# Patient Record
Sex: Male | Born: 1950 | Race: White | Hispanic: No | Smoking: Former smoker
Health system: Southern US, Community
[De-identification: ages and names within clinical notes are randomized; demographics above are authoritative.]

## PROBLEM LIST (undated history)

## (undated) DIAGNOSIS — E785 Hyperlipidemia, unspecified: Principal | ICD-10-CM

## (undated) DIAGNOSIS — M1990 Unspecified osteoarthritis, unspecified site: Secondary | ICD-10-CM

## (undated) DIAGNOSIS — C61 Malignant neoplasm of prostate: Secondary | ICD-10-CM

## (undated) DIAGNOSIS — R350 Frequency of micturition: Secondary | ICD-10-CM

## (undated) DIAGNOSIS — Z973 Presence of spectacles and contact lenses: Secondary | ICD-10-CM

## (undated) DIAGNOSIS — F1021 Alcohol dependence, in remission: Secondary | ICD-10-CM

## (undated) HISTORY — PX: PROSTATE BIOPSY: SHX241

## (undated) HISTORY — DX: Malignant neoplasm of prostate: C61

## (undated) HISTORY — DX: Hyperlipidemia, unspecified: E78.5

---

## 2014-08-19 ENCOUNTER — Ambulatory Visit (INDEPENDENT_AMBULATORY_CARE_PROVIDER_SITE_OTHER): Payer: Medicare HMO | Admitting: Medical

## 2014-08-19 ENCOUNTER — Encounter: Payer: Self-pay | Admitting: Medical

## 2014-08-19 VITALS — BP 130/73 | HR 68 | Temp 98.1°F | Ht 70.0 in | Wt 171.6 lb

## 2014-08-19 DIAGNOSIS — F1021 Alcohol dependence, in remission: Secondary | ICD-10-CM

## 2014-08-19 DIAGNOSIS — H269 Unspecified cataract: Secondary | ICD-10-CM

## 2014-08-19 DIAGNOSIS — K4091 Unilateral inguinal hernia, without obstruction or gangrene, recurrent: Secondary | ICD-10-CM

## 2014-08-19 DIAGNOSIS — K4090 Unilateral inguinal hernia, without obstruction or gangrene, not specified as recurrent: Secondary | ICD-10-CM | POA: Insufficient documentation

## 2014-08-19 NOTE — Assessment & Plan Note (Signed)
In recovery now for 17 yrs. No use of alcohol .Attends AA.

## 2014-08-19 NOTE — Assessment & Plan Note (Signed)
This hernia is not new. Gradualy getting larger. No signs or symptoms or incarceration or stangulation. Moderate to large size so will go ahead and refer him to general surgeon.

## 2014-08-19 NOTE — Progress Notes (Signed)
Subjective:    Patient ID: Cory Willis, male    DOB: May 22, 1951, 64 y.o.   MRN: [id number]  HPI   I have reviewed pt PMH, PSH, FH, Social History and Surgical History  Pt in for first visit.   His history is minimal. Pt never had regular visits in the past since he had no insurance. Pt never had check up.  He wears glasses optometrist told him he may have early cataracts.   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 He attends Attends AA 2 meeting a week.   Mother open heat surgery at 78 yo. Dad deceased 70 yo. Health hx?? Minimal contact with family.  Pt uses snuff. (no oral lesions reported)  Pt has recently lt lower quadrant bulge in 2001. No fever, no chills, no vomiting. Occasional mild pain.  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 20 oz coffee total a day, no exercise. Not married.   Some frequent urination at nights.6-7 times at lunch.  Past Medical History  Diagnosis Date  . Cataract     Starting  . Substance abuse     Former Patent examiner    History   Social History  . Marital Status: Divorced    Spouse Name: N/A    Number of Children: N/A  . Years of Education: N/A   Occupational History  . Not on file.   Social History Main Topics  . Smoking status: Former Smoker    Quit date: 08/20/2007  . Smokeless tobacco: Current User    Types: Snuff  . Alcohol Use: No     Comment: Reformed Alcoholic 17 years  . Drug Use: Not on file  . Sexual Activity: Not on file   Other Topics Concern  . Not on file   Social History Narrative  . No narrative on file    No past surgical history on file.  Family History  Problem Relation Age of Onset  . Heart disease Mother     No Known Allergies  No current outpatient prescriptions on file prior to visit.   No current facility-administered medications on file prior to visit.    BP 130/73 mmHg  Pulse 68  Temp(Src) 98.1 F (36.7 C) (Oral)  Ht 5\' 10"  (1.778 m)  Wt 171 lb 9.6 oz (77.837 kg)   BMI 24.62 kg/m2  SpO2 98%      Review of Systems  Constitutional: Negative for fever, chills and fatigue.  HENT: Negative for congestion, ear discharge, ear pain, nosebleeds, postnasal drip, rhinorrhea, sinus pressure, sneezing, sore throat and trouble swallowing.   Respiratory: Negative for cough, chest tightness, shortness of breath and wheezing.   Cardiovascular: Negative for chest pain and palpitations.  Gastrointestinal: Negative for nausea, abdominal pain, diarrhea and rectal pain.  Endocrine: Positive for polyuria.  Genitourinary: Negative for dysuria, frequency, hematuria, flank pain, decreased urine volume, penile swelling, scrotal swelling, difficulty urinating, genital sores, penile pain and testicular pain.       Llq buldge.  Musculoskeletal: Negative for back pain.  Neurological: Negative for dizziness, tremors, seizures, syncope, facial asymmetry, weakness, light-headedness, numbness and headaches.  Hematological: Negative for adenopathy. Does not bruise/bleed easily.  Psychiatric/Behavioral: Negative for suicidal ideas, behavioral problems, self-injury and dysphoric mood. The patient is not nervous/anxious.        Objective:   Physical Exam   General Mental Status- Alert. Orientation- Oriented x3.  Build and Nutrition- Well nourished and Well Developed.  Skin General:-Normal. Color- Normal color. Moisture- Normal.  Temperature-Warm.  HEENT  Ears- Normal. Auditory Canal- Bilateral-Normal. Tympanic Membrane- Bilateral-Normal. Eye Fundi-Bilateral-Normal. Pupil- bilateral- Direct reaction to light normal. Nose & Sinuses- Normal. Nostrils-Bilateral- Normal. Mouth & Throat- Poor dentition.   Neck Neck- No Bruits or Masses. Trachea midline.  Thyroid- Normal.  Chest and Lung Exam Percussion: Quality and Intensity-Percussion normal. Percussion of the chest reveals- No Dullness.  Palpation: Palpation of the chest reveals- Non-tender- No  dullness. Auscultation: Breath Sounds- Normal.  Adventitous Sounds:-No adventitious sounds.  Cardiovascular Inspection:- No Heaves. Auscultation:-Normal sinus rhythm without murmur gallop, S1 WNL and S2 WNL.  Abdomen Inspection:-Inspection Normal. Inspection of the abdomen reveals- No hernias Palpation/Percussion:- Palpation and Percussion of the Abdomen reveal- Non Tender and No Palpable abdominal masses. Liver: Other Characteristics- No hepatomegaly. Spleen:Other Characteristics- No Splenomegaly. Auscultation:- Auscultation of the abdomen reveals- Bowel sounds normal and No Abdominal bruits.  Male Genitourinary Urethra:- No discharge. Penis- Circumcised. Scrotum- No masses. Testes- Bilateral-Normal. Lt inguinal- canal large hernia. Does not reduce easily.  Rectal Anorectal Exam: Performed- Normal sphincter tone. No masses noted. Prostate smooth normal size. Stool HEME Negative.  Peripheral  Vascular Lower Extremity:Inspection- Bilateral-Inspection Normal  Palpation: Femoral pulse- Bilateral- 2+. Popliteal pulse- Bilateral-2+. Dorsalis pedis pulse- Bilateral- 1/2+. Edema- Bilateral- No edema.  Neurologic Mental Status:- Normal. Cranial Nerves:-Normal Bilaterally. Motor:-Normal. Strength:5/5 normal muscle strength-All Muscles. General Assessment of Reflexes: Right Knee-2+. Left Knee- 2+. Coordination-Normal. Gait- Normal.  Meningeal Signs- None.  Musculoskeletal Global Assessment General-Joints show full range of motion without obvious deformity and Normal muscle mass. Strength in upper and lower extremities.  Lymphatics General lymphatics Description- No generalized lymphadenopathy.        Assessment & Plan:  Regarding pt intermittent polyuria will get cmp next week and assess sugar on cpe.

## 2014-08-19 NOTE — Patient Instructions (Addendum)
I am going to refer you to surgeon for your hernia. I want you to reschedule for complete physical next Thursday  Morning at 8:15 and come in fasting.  Today was appointment today history. Meet and greet. CPE/welcome to medicare physcial next week.

## 2014-08-19 NOTE — Assessment & Plan Note (Signed)
Mild early per pt.

## 2014-08-19 NOTE — Progress Notes (Signed)
Pre visit review using our clinic review tool, if applicable. No additional management support is needed unless otherwise documented below in the visit note. 

## 2014-08-26 ENCOUNTER — Telehealth: Payer: Self-pay | Admitting: Medical

## 2014-08-26 ENCOUNTER — Ambulatory Visit (INDEPENDENT_AMBULATORY_CARE_PROVIDER_SITE_OTHER): Payer: Medicare HMO | Admitting: Medical

## 2014-08-26 ENCOUNTER — Encounter: Payer: Self-pay | Admitting: Internal Medicine

## 2014-08-26 ENCOUNTER — Encounter: Payer: Self-pay | Admitting: Medical

## 2014-08-26 VITALS — BP 130/80 | HR 67 | Temp 98.0°F | Ht 70.0 in | Wt 171.2 lb

## 2014-08-26 DIAGNOSIS — H9193 Unspecified hearing loss, bilateral: Secondary | ICD-10-CM

## 2014-08-26 DIAGNOSIS — Z0000 Encounter for general adult medical examination without abnormal findings: Secondary | ICD-10-CM | POA: Insufficient documentation

## 2014-08-26 DIAGNOSIS — Z87891 Personal history of nicotine dependence: Secondary | ICD-10-CM

## 2014-08-26 DIAGNOSIS — Z1211 Encounter for screening for malignant neoplasm of colon: Secondary | ICD-10-CM

## 2014-08-26 DIAGNOSIS — Z0189 Encounter for other specified special examinations: Secondary | ICD-10-CM

## 2014-08-26 DIAGNOSIS — R972 Elevated prostate specific antigen [PSA]: Secondary | ICD-10-CM

## 2014-08-26 DIAGNOSIS — Z136 Encounter for screening for cardiovascular disorders: Secondary | ICD-10-CM

## 2014-08-26 DIAGNOSIS — Z125 Encounter for screening for malignant neoplasm of prostate: Secondary | ICD-10-CM

## 2014-08-26 DIAGNOSIS — Z79899 Other long term (current) drug therapy: Secondary | ICD-10-CM

## 2014-08-26 DIAGNOSIS — Z23 Encounter for immunization: Secondary | ICD-10-CM

## 2014-08-26 LAB — COMPREHENSIVE METABOLIC PANEL
ALBUMIN: 4.2 g/dL (ref 3.5–5.2)
ALT: 8 U/L (ref 0–53)
AST: 11 U/L (ref 0–37)
Alkaline Phosphatase: 56 U/L (ref 39–117)
BILIRUBIN TOTAL: 0.6 mg/dL (ref 0.2–1.2)
BUN: 9 mg/dL (ref 6–23)
CO2: 31 mEq/L (ref 19–32)
Calcium: 10.7 mg/dL — ABNORMAL HIGH (ref 8.4–10.5)
Chloride: 103 mEq/L (ref 96–112)
Creatinine, Ser: 0.81 mg/dL (ref 0.40–1.50)
GFR: 101.43 mL/min (ref >60.00)
Glucose, Bld: 95 mg/dL (ref 70–99)
POTASSIUM: 4.4 meq/L (ref 3.5–5.1)
SODIUM: 139 meq/L (ref 135–145)
TOTAL PROTEIN: 6.8 g/dL (ref 6.0–8.3)

## 2014-08-26 LAB — CBC WITH DIFFERENTIAL/PLATELET
BASOS ABS: 0 10*3/uL (ref 0.0–0.1)
Basophils Relative: 0.2 % (ref 0.0–3.0)
Eosinophils Absolute: 0.1 10*3/uL (ref 0.0–0.7)
Eosinophils Relative: 1.7 % (ref 0.0–5.0)
HCT: 43.5 % (ref 39.0–52.0)
Hemoglobin: 14.9 g/dL (ref 13.0–17.0)
LYMPHS ABS: 0.9 10*3/uL (ref 0.7–4.0)
Lymphocytes Relative: 11.5 % — ABNORMAL LOW (ref 12.0–46.0)
MCHC: 34.2 g/dL (ref 30.0–36.0)
MCV: 88.9 fl (ref 78.0–100.0)
MONO ABS: 0.7 10*3/uL (ref 0.1–1.0)
MONOS PCT: 9.7 % (ref 3.0–12.0)
NEUTROS ABS: 5.9 10*3/uL (ref 1.4–7.7)
Neutrophils Relative %: 76.9 % (ref 43.0–77.0)
PLATELETS: 308 10*3/uL (ref 150.0–400.0)
RBC: 4.9 Mil/uL (ref 4.22–5.81)
RDW: 13.3 % (ref 11.5–15.5)
WBC: 7.7 10*3/uL (ref 4.0–10.5)

## 2014-08-26 LAB — LIPID PANEL
CHOLESTEROL: 190 mg/dL (ref 0–200)
HDL: 47.3 mg/dL (ref >39.00)
LDL Cholesterol: 115 mg/dL — ABNORMAL HIGH (ref 0–99)
NonHDL: 142.7
Total CHOL/HDL Ratio: 4
Triglycerides: 138 mg/dL (ref 0.0–149.0)
VLDL: 27.6 mg/dL (ref 0.0–40.0)

## 2014-08-26 LAB — PSA: PSA: 6.22 ng/mL — AB (ref 0.10–4.00)

## 2014-08-26 MED ORDER — PNEUMOCOCCAL 13-VAL CONJ VACC IM SUSP: 0.5000 mL | INTRAMUSCULAR | Status: DC

## 2014-08-26 MED ORDER — AZITHROMYCIN 250 MG PO TABS: ORAL_TABLET | ORAL | Status: DC

## 2014-08-26 NOTE — Telephone Encounter (Signed)
Rx azithromycin if current mild symptoms on awv worsen as discussed.

## 2014-08-26 NOTE — Progress Notes (Signed)
Subjective:    Patient ID: Cory Willis, male    DOB: 03/18/1951, 64 y.o.   MRN: [id number]  HPI  Pt in today for physical exam years since any care has been done.  Pt never had lipid panel done. No hx of diabetes. No Abdominal ultrasound done in past. No colonsocpy, no psa.. Basically never had any medical care done. Please see HPI on last visit which was extensive.       Review of Systems  See smart set ros. Objective:   Physical Exam See smart set pe.     Assessment & Plan:   Subjective:    Cory Willis is a 64 y.o. male who presents for a welcome to Medicare exam.   Cardiac risk factors: advanced age (older than 28 for men, 12 for women). Pt has not been see in past no diagnosis in past. Mother bypass at 19yo. No smoker.  Depression Screen (Note: if answer to either of the following is "Yes", a more complete depression screening is indicated)  Q1: Over the past two weeks, have you felt down, depressed or hopeless? no Q2: Over the past two weeks, have you felt little interest or pleasure in doing things? no  Activities of Daily Living In your present state of health, do you have any difficulty performing the following activities?:  Preparing food and eating?: No Bathing yourself: No Getting dressed: No Using the toilet:No Moving around from place to place: No In the past year have you fallen or had a near fall?:No  Current exercise habits: Pt does not exercise.   Dietary issues discussed: No dietary issues. Hearing difficulties: No Safe in current home environment: no  Pt did poorly on hearing test. Review of Systems A comprehensive review of systems was negative except for: Mild scratchy throat and mild nasal congestion only.   Pt did serial 7 well/passed and recall 3 objects passed.   Objective:    Vision by Snellen chart: right eye: 20/25, left eye:20/25. Both 20/20 with glasses. Blood pressure 152/94, pulse 67, temperature 98 F (36.7 C),  temperature source Oral, height 5\' 10"  (1.778 m), weight 171 lb 3.2 oz (77.656 kg), SpO2 98 %. Body mass index is 24.56 kg/(m^2).   General Mental Status- Alert. Orientation- Oriented x3.  Build and Nutrition- Well nourished and Well Developed.  Skin General:-Normal. Color- Normal color. Moisture- Normal. Temperature-Warm.  Small moles on back but no worrisome lesions presently.  HEENT  Ears- Normal. Auditory Canal- Bilateral-Normal. Tympanic Membrane- Bilateral-Normal. Eye Fundi-Bilateral-Normal. Pupil- bilateral- Direct reaction to light normal. Nose & Sinuses- Normal. Nostrils-Bilateral- Normal. Mouth & Throat-Normal.  Neck Neck- No Bruits or Masses. Trachea midline.  Thyroid- Normal.  Chest and Lung Exam Percussion: Quality and Intensity-Percussion normal. Percussion of the chest reveals- No Dullness.  Palpation: Palpation of the chest reveals- Non-tender- No dullness. Auscultation: Breath Sounds- Normal.  Adventitous Sounds:-No adventitious sounds.  Cardiovascular Inspection:- No Heaves. Auscultation:-Normal sinus rhythm without murmur gallop, S1 WNL and S2 WNL.  Abdomen Inspection:-Inspection Normal. Inspection of the abdomen reveals- No hernias Palpation/Percussion:- Palpation and Percussion of the Abdomen reveal- Non Tender and No Palpable abdominal masses. Liver: Other Characteristics- No hepatomegaly. Spleen:Other Characteristics- No Splenomegaly. Auscultation:- Auscultation of the abdomen reveals- Bowel sounds normal and No Abdominal bruits.  Male Genitourinary Not done today. Done on last visit.  Rectal Not done today. Done on last visit  Peripheral  Vascular Lower Extremity:Inspection- Bilateral-Inspection Normal  Palpation: Femoral pulse- Bilateral- 2+. Popliteal pulse- Bilateral-2+. Dorsalis pedis pulse- Bilateral- 1/2+.  Edema- Bilateral- No edema.  Neurologic Mental Status:- Normal. Cranial Nerves:-Normal  Bilaterally. Motor:-Normal. Strength:5/5 normal muscle strength-All Muscles. General Assessment of Reflexes: Right Knee-2+. Left Knee- 2+. Coordination-Normal. Gait- Normal.  Meningeal Signs- None.  Musculoskeletal Global Assessment General-Joints show full range of motion without obvious deformity and Normal muscle mass. Strength in upper and lower extremities.  Lymphatics General lymphatics Description- No generalized lymphadenopathy.   Assessment:   Order cbc, cmp, tsh, lipid panel, psa, refer pt to gi screening colonoscopy. Abdomina ultrasound for screening AAA, refer pt to audiology, psv, fluvaccine,(tdap and zostavax discussed).  Pt declined flu vaccine, tdap and zostavax today. He only wants one vaccine  today.  Also will get ct of chest. Pt 40 yr smoker. Stopped 7 yrs ago. On average pack a day.   ADL info explained and given to pt.  ekg done today as well.     Plan:    During the course of the visit the patient was educated and counseled about appropriate screening and preventive services including:  Patient presents for yearly preventative medicine examination. Medicare questionnaire was completed  All immunizations and health maintenance protocols were reviewed with the patient and needed orders were placed.  Appropriate screening laboratory values were ordered for the patient including screening of hyperlipidemia, renal function and hepatic function. If indicated by BPH, a PSA was ordered.  Medication reconciliation,  past medical history, social history, problem list and allergies were reviewed in detail with the patient  Goals were established with regard to weight loss, exercise, and  diet in compliance with medications  End of life planning was discussed.   Patient Instructions (the written plan) was given to the patient.

## 2014-08-26 NOTE — Telephone Encounter (Signed)
Referal to urologist for elevated psa.

## 2014-08-26 NOTE — Patient Instructions (Addendum)
Order cbc, cmp, tsh, lipid panel, psa, refer pt to gi screening colonoscopy. Abdomina ultrasound for screening AAA, refer pt to audiology, psv, fluvaccine,(tdap and zostavax discussed).   If your mild scratchy throat worsens, or nasal congestion turns into sinus pressure then go ahead and start azithromycin. I am making that available. Presently you could just use plane mucinex and flonase otc.  Preventive Care for Adults A healthy lifestyle and preventive care can promote health and wellness. Preventive health guidelines for men include the following key practices:  A routine yearly physical is a good way to check with your health care provider about your health and preventative screening. It is a chance to share any concerns and updates on your health and to receive a thorough exam.  Visit your dentist for a routine exam and preventative care every 6 months. Brush your teeth twice a day and floss once a day. Good oral hygiene prevents tooth decay and gum disease.  The frequency of eye exams is based on your age, health, family medical history, use of contact lenses, and other factors. Follow your health care provider's recommendations for frequency of eye exams.  Eat a healthy diet. Foods such as vegetables, fruits, whole grains, low-fat dairy products, and lean protein foods contain the nutrients you need without too many calories. Decrease your intake of foods high in solid fats, added sugars, and salt. Eat the right amount of calories for you.Get information about a proper diet from your health care provider, if necessary.  Regular physical exercise is one of the most important things you can do for your health. Most adults should get at least 150 minutes of moderate-intensity exercise (any activity that increases your heart rate and causes you to sweat) each week. In addition, most adults need muscle-strengthening exercises on 2 or more days a week.  Maintain a healthy weight. The body mass  index (BMI) is a screening tool to identify possible weight problems. It provides an estimate of body fat based on height and weight. Your health care provider can find your BMI and can help you achieve or maintain a healthy weight.For adults 20 years and older:  A BMI below 18.5 is considered underweight.  A BMI of 18.5 to 24.9 is normal.  A BMI of 25 to 29.9 is considered overweight.  A BMI of 30 and above is considered obese.  Maintain normal blood lipids and cholesterol levels by exercising and minimizing your intake of saturated fat. Eat a balanced diet with plenty of fruit and vegetables. Blood tests for lipids and cholesterol should begin at age 11 and be repeated every 5 years. If your lipid or cholesterol levels are high, you are over 50, or you are at high risk for heart disease, you may need your cholesterol levels checked more frequently.Ongoing high lipid and cholesterol levels should be treated with medicines if diet and exercise are not working.  If you smoke, find out from your health care provider how to quit. If you do not use tobacco, do not start.  Lung cancer screening is recommended for adults aged 2-80 years who are at high risk for developing lung cancer because of a history of smoking. A yearly low-dose CT scan of the lungs is recommended for people who have at least a 30-pack-year history of smoking and are a current smoker or have quit within the past 15 years. A pack year of smoking is smoking an average of 1 pack of cigarettes a day for 1 year (for  example: 1 pack a day for 30 years or 2 packs a day for 15 years). Yearly screening should continue until the smoker has stopped smoking for at least 15 years. Yearly screening should be stopped for people who develop a health problem that would prevent them from having lung cancer treatment.  If you choose to drink alcohol, do not have more than 2 drinks per day. One drink is considered to be 12 ounces (355 mL) of beer, 5  ounces (148 mL) of wine, or 1.5 ounces (44 mL) of liquor.  Avoid use of street drugs. Do not share needles with anyone. Ask for help if you need support or instructions about stopping the use of drugs.  High blood pressure causes heart disease and increases the risk of stroke. Your blood pressure should be checked at least every 1-2 years. Ongoing high blood pressure should be treated with medicines, if weight loss and exercise are not effective.  If you are 36-36 years old, ask your health care provider if you should take aspirin to prevent heart disease.  Diabetes screening involves taking a blood sample to check your fasting blood sugar level. This should be done once every 3 years, after age 20, if you are within normal weight and without risk factors for diabetes. Testing should be considered at a younger age or be carried out more frequently if you are overweight and have at least 1 risk factor for diabetes.  Colorectal cancer can be detected and often prevented. Most routine colorectal cancer screening begins at the age of 42 and continues through age 84. However, your health care provider may recommend screening at an earlier age if you have risk factors for colon cancer. On a yearly basis, your health care provider may provide home test kits to check for hidden blood in the stool. Use of a small camera at the end of a tube to directly examine the colon (sigmoidoscopy or colonoscopy) can detect the earliest forms of colorectal cancer. Talk to your health care provider about this at age 78, when routine screening begins. Direct exam of the colon should be repeated every 5-10 years through age 10, unless early forms of precancerous polyps or small growths are found.  People who are at an increased risk for hepatitis B should be screened for this virus. You are considered at high risk for hepatitis B if:  You were born in a country where hepatitis B occurs often. Talk with your health care  provider about which countries are considered high risk.  Your parents were born in a high-risk country and you have not received a shot to protect against hepatitis B (hepatitis B vaccine).  You have HIV or AIDS.  You use needles to inject street drugs.  You live with, or have sex with, someone who has hepatitis B.  You are a man who has sex with other men (MSM).  You get hemodialysis treatment.  You take certain medicines for conditions such as cancer, organ transplantation, and autoimmune conditions.  Hepatitis C blood testing is recommended for all people born from [birth year] through 1965 and any individual with known risks for hepatitis C.  Practice safe sex. Use condoms and avoid high-risk sexual practices to reduce the spread of sexually transmitted infections (STIs). STIs include gonorrhea, chlamydia, syphilis, trichomonas, herpes, HPV, and human immunodeficiency virus (HIV). Herpes, HIV, and HPV are viral illnesses that have no cure. They can result in disability, cancer, and death.  If you are at risk of  being infected with HIV, it is recommended that you take a prescription medicine daily to prevent HIV infection. This is called preexposure prophylaxis (PrEP). You are considered at risk if:  You are a man who has sex with other men (MSM) and have other risk factors.  You are a heterosexual man, are sexually active, and are at increased risk for HIV infection.  You take drugs by injection.  You are sexually active with a partner who has HIV.  Talk with your health care provider about whether you are at high risk of being infected with HIV. If you choose to begin PrEP, you should first be tested for HIV. You should then be tested every 3 months for as long as you are taking PrEP.  A one-time screening for abdominal aortic aneurysm (AAA) and surgical repair of large AAAs by ultrasound are recommended for men ages 27 to 39 years who are current or former smokers.  Healthy men  should no longer receive prostate-specific antigen (PSA) blood tests as part of routine cancer screening. Talk with your health care provider about prostate cancer screening.  Testicular cancer screening is not recommended for adult males who have no symptoms. Screening includes self-exam, a health care provider exam, and other screening tests. Consult with your health care provider about any symptoms you have or any concerns you have about testicular cancer.  Use sunscreen. Apply sunscreen liberally and repeatedly throughout the day. You should seek shade when your shadow is shorter than you. Protect yourself by wearing long sleeves, pants, a wide-brimmed hat, and sunglasses year round, whenever you are outdoors.  Once a month, do a whole-body skin exam, using a mirror to look at the skin on your back. Tell your health care provider about new moles, moles that have irregular borders, moles that are larger than a pencil eraser, or moles that have changed in shape or color.  Stay current with required vaccines (immunizations).  Influenza vaccine. All adults should be immunized every year.  Tetanus, diphtheria, and acellular pertussis (Td, Tdap) vaccine. An adult who has not previously received Tdap or who does not know his vaccine status should receive 1 dose of Tdap. This initial dose should be followed by tetanus and diphtheria toxoids (Td) booster doses every 10 years. Adults with an unknown or incomplete history of completing a 3-dose immunization series with Td-containing vaccines should begin or complete a primary immunization series including a Tdap dose. Adults should receive a Td booster every 10 years.  Varicella vaccine. An adult without evidence of immunity to varicella should receive 2 doses or a second dose if he has previously received 1 dose.  Human papillomavirus (HPV) vaccine. Males aged 2-21 years who have not received the vaccine previously should receive the 3-dose series. Males  aged 22-26 years may be immunized. Immunization is recommended through the age of 37 years for any male who has sex with males and did not get any or all doses earlier. Immunization is recommended for any person with an immunocompromised condition through the age of 67 years if he did not get any or all doses earlier. During the 3-dose series, the second dose should be obtained 4-8 weeks after the first dose. The third dose should be obtained 24 weeks after the first dose and 16 weeks after the second dose.  Zoster vaccine. One dose is recommended for adults aged 68 years or older unless certain conditions are present.  Measles, mumps, and rubella (MMR) vaccine. Adults born before [birth year] generally  are considered immune to measles and mumps. Adults born in [birth year] or later should have 1 or more doses of MMR vaccine unless there is a contraindication to the vaccine or there is laboratory evidence of immunity to each of the three diseases. A routine second dose of MMR vaccine should be obtained at least 28 days after the first dose for students attending postsecondary schools, health care workers, or international travelers. People who received inactivated measles vaccine or an unknown type of measles vaccine during 1963-1967 should receive 2 doses of MMR vaccine. People who received inactivated mumps vaccine or an unknown type of mumps vaccine before 1979 and are at high risk for mumps infection should consider immunization with 2 doses of MMR vaccine. Unvaccinated health care workers born before [birth year] who lack laboratory evidence of measles, mumps, or rubella immunity or laboratory confirmation of disease should consider measles and mumps immunization with 2 doses of MMR vaccine or rubella immunization with 1 dose of MMR vaccine.  Pneumococcal 13-valent conjugate (PCV13) vaccine. When indicated, a person who is uncertain of his immunization history and has no record of immunization should receive the PCV13 vaccine.  An adult aged 33 years or older who has certain medical conditions and has not been previously immunized should receive 1 dose of PCV13 vaccine. This PCV13 should be followed with a dose of pneumococcal polysaccharide (PPSV23) vaccine. The PPSV23 vaccine dose should be obtained at least 8 weeks after the dose of PCV13 vaccine. An adult aged 66 years or older who has certain medical conditions and previously received 1 or more doses of PPSV23 vaccine should receive 1 dose of PCV13. The PCV13 vaccine dose should be obtained 1 or more years after the last PPSV23 vaccine dose.  Pneumococcal polysaccharide (PPSV23) vaccine. When PCV13 is also indicated, PCV13 should be obtained first. All adults aged 26 years and older should be immunized. An adult younger than age 58 years who has certain medical conditions should be immunized. Any person who resides in a nursing home or long-term care facility should be immunized. An adult smoker should be immunized. People with an immunocompromised condition and certain other conditions should receive both PCV13 and PPSV23 vaccines. People with human immunodeficiency virus (HIV) infection should be immunized as soon as possible after diagnosis. Immunization during chemotherapy or radiation therapy should be avoided. Routine use of PPSV23 vaccine is not recommended for American Indians, [place] Natives, or people younger than 65 years unless there are medical conditions that require PPSV23 vaccine. When indicated, people who have unknown immunization and have no record of immunization should receive PPSV23 vaccine. One-time revaccination 5 years after the first dose of PPSV23 is recommended for people aged 19-64 years who have chronic kidney failure, nephrotic syndrome, asplenia, or immunocompromised conditions. People who received 1-2 doses of PPSV23 before age 30 years should receive another dose of PPSV23 vaccine at age 38 years or later if at least 5 years have passed since the  previous dose. Doses of PPSV23 are not needed for people immunized with PPSV23 at or after age 3 years.  Meningococcal vaccine. Adults with asplenia or persistent complement component deficiencies should receive 2 doses of quadrivalent meningococcal conjugate (MenACWY-D) vaccine. The doses should be obtained at least 2 months apart. Microbiologists working with certain meningococcal bacteria, [place] recruits, people at risk during an outbreak, and people who travel to or live in countries with a high rate of meningitis should be immunized. A first-year college student up through age 56 years who is  living in a residence hall should receive a dose if he did not receive a dose on or after his 16th birthday. Adults who have certain high-risk conditions should receive one or more doses of vaccine.  Hepatitis A vaccine. Adults who wish to be protected from this disease, have certain high-risk conditions, work with hepatitis A-infected animals, work in hepatitis A research labs, or travel to or work in countries with a high rate of hepatitis A should be immunized. Adults who were previously unvaccinated and who anticipate close contact with an international adoptee during the first 60 days after arrival in the [place] from a country with a high rate of hepatitis A should be immunized.  Hepatitis B vaccine. Adults should be immunized if they wish to be protected from this disease, have certain high-risk conditions, may be exposed to blood or other infectious body fluids, are household contacts or sex partners of hepatitis B positive people, are clients or workers in certain care facilities, or travel to or work in countries with a high rate of hepatitis B.  Haemophilus influenzae type b (Hib) vaccine. A previously unvaccinated person with asplenia or sickle cell disease or having a scheduled splenectomy should receive 1 dose of Hib vaccine. Regardless of previous immunization, a recipient of a  hematopoietic stem cell transplant should receive a 3-dose series 6-12 months after his successful transplant. Hib vaccine is not recommended for adults with HIV infection. Preventive Service / Frequency Ages 62 to 59  Blood pressure check.** / Every 1 to 2 years.  Lipid and cholesterol check.** / Every 5 years beginning at age 50.  Hepatitis C blood test.** / For any individual with known risks for hepatitis C.  Skin self-exam. / Monthly.  Influenza vaccine. / Every year.  Tetanus, diphtheria, and acellular pertussis (Tdap, Td) vaccine.** / Consult your health care provider. 1 dose of Td every 10 years.  Varicella vaccine.** / Consult your health care provider.  HPV vaccine. / 3 doses over 6 months, if 70 or younger.  Measles, mumps, rubella (MMR) vaccine.** / You need at least 1 dose of MMR if you were born in 1957 or later. You may also need a second dose.  Pneumococcal 13-valent conjugate (PCV13) vaccine.** / Consult your health care provider.  Pneumococcal polysaccharide (PPSV23) vaccine.** / 1 to 2 doses if you smoke cigarettes or if you have certain conditions.  Meningococcal vaccine.** / 1 dose if you are age 12 to 15 years and a Market researcher living in a residence hall, or have one of several medical conditions. You may also need additional booster doses.  Hepatitis A vaccine.** / Consult your health care provider.  Hepatitis B vaccine.** / Consult your health care provider.  Haemophilus influenzae type b (Hib) vaccine.** / Consult your health care provider. Ages 23 to 71  Blood pressure check.** / Every 1 to 2 years.  Lipid and cholesterol check.** / Every 5 years beginning at age 54.  Lung cancer screening. / Every year if you are aged 56-80 years and have a 30-pack-year history of smoking and currently smoke or have quit within the past 15 years. Yearly screening is stopped once you have quit smoking for at least 15 years or develop a health problem  that would prevent you from having lung cancer treatment.  Fecal occult blood test (FOBT) of stool. / Every year beginning at age 49 and continuing until age 60. You may not have to do this test if you get a colonoscopy  every 10 years.  Flexible sigmoidoscopy** or colonoscopy.** / Every 5 years for a flexible sigmoidoscopy or every 10 years for a colonoscopy beginning at age 50 and continuing until age 48.  Hepatitis C blood test.** / For all people born from [birth year] through 1967 and any individual with known risks for hepatitis C.  Skin self-exam. / Monthly.  Influenza vaccine. / Every year.  Tetanus, diphtheria, and acellular pertussis (Tdap/Td) vaccine.** / Consult your health care provider. 1 dose of Td every 10 years.  Varicella vaccine.** / Consult your health care provider.  Zoster vaccine.** / 1 dose for adults aged 23 years or older.  Measles, mumps, rubella (MMR) vaccine.** / You need at least 1 dose of MMR if you were born in 1959 or later. You may also need a second dose.  Pneumococcal 13-valent conjugate (PCV13) vaccine.** / Consult your health care provider.  Pneumococcal polysaccharide (PPSV23) vaccine.** / 1 to 2 doses if you smoke cigarettes or if you have certain conditions.  Meningococcal vaccine.** / Consult your health care provider.  Hepatitis A vaccine.** / Consult your health care provider.  Hepatitis B vaccine.** / Consult your health care provider.  Haemophilus influenzae type b (Hib) vaccine.** / Consult your health care provider. Ages 34 and over  Blood pressure check.** / Every 1 to 2 years.  Lipid and cholesterol check.**/ Every 5 years beginning at age 66.  Lung cancer screening. / Every year if you are aged 74-80 years and have a 30-pack-year history of smoking and currently smoke or have quit within the past 15 years. Yearly screening is stopped once you have quit smoking for at least 15 years or develop a health problem that would prevent you from  having lung cancer treatment.  Fecal occult blood test (FOBT) of stool. / Every year beginning at age 74 and continuing until age 6. You may not have to do this test if you get a colonoscopy every 10 years.  Flexible sigmoidoscopy** or colonoscopy.** / Every 5 years for a flexible sigmoidoscopy or every 10 years for a colonoscopy beginning at age 48 and continuing until age 76.  Hepatitis C blood test.** / For all people born from [birth year] through 1967 and any individual with known risks for hepatitis C.  Abdominal aortic aneurysm (AAA) screening.** / A one-time screening for ages 71 to 64 years who are current or former smokers.  Skin self-exam. / Monthly.  Influenza vaccine. / Every year.  Tetanus, diphtheria, and acellular pertussis (Tdap/Td) vaccine.** / 1 dose of Td every 10 years.  Varicella vaccine.** / Consult your health care provider.  Zoster vaccine.** / 1 dose for adults aged 32 years or older.  Pneumococcal 13-valent conjugate (PCV13) vaccine.** / Consult your health care provider.  Pneumococcal polysaccharide (PPSV23) vaccine.** / 1 dose for all adults aged 74 years and older.  Meningococcal vaccine.** / Consult your health care provider.  Hepatitis A vaccine.** / Consult your health care provider.  Hepatitis B vaccine.** / Consult your health care provider.  Haemophilus influenzae type b (Hib) vaccine.** / Consult your health care provider. **Family history and personal history of risk and conditions may change your health care provider's recommendations. Document Released: 09/18/2001 Document Revised: 07/28/2013 Document Reviewed: 12/18/2010 [facility] Patient Information 2015 [place], [place]. This information is not intended to replace advice given to you by your health care provider. Make sure you discuss any questions you have with your health care provider.

## 2014-08-26 NOTE — Progress Notes (Signed)
Pre visit review using our clinic review tool, if applicable. No additional management support is needed unless otherwise documented below in the visit note. 

## 2014-08-30 ENCOUNTER — Encounter (INDEPENDENT_AMBULATORY_CARE_PROVIDER_SITE_OTHER): Payer: Self-pay | Admitting: General Surgery

## 2014-08-30 NOTE — Progress Notes (Signed)
Patient ID: Cory Willis, male   DOB: 04-09-1951, 63 y.o.   MRN: [id number]  Cory Willis 08/30/2014 1:52 PM Location: [facility] Patient #: [id number] DOB: [birth year] Single / Language: Cleophus Willis / Race: White Male History of Present Illness Cory Hollingshead MD; 08/30/2014 2:59 PM) Patient words: new patient hernia.  The patient is a 64 year old male    Note:He is referred by Cory Pai, PA because of a left inguinal hernia. He has had the hernia since 2001. It has slowly been getting larger and is uncomfortable at times. He manually reduces it daily. No difficulty with urination or BMs. He wears hernia underwear. He is interested in hernia repair.  Past Surgical History Cory Leventhal, RN, BSN; 08/30/2014 1:52 PM) No pertinent past surgical history  Diagnostic Studies History (Cory Leventhal, RN, BSN; 08/30/2014 1:52 PM) Colonoscopy never  Allergies Cory Leventhal, RN, BSN; 08/30/2014 1:53 PM) No Known Drug Allergies 08/30/2014  Medication History Cory Leventhal, RN, BSN; 08/30/2014 1:53 PM) No Current Medications Medications Reconciled  Social History Cory Leventhal, RN, BSN; 08/30/2014 1:52 PM) Alcohol use Remotely quit alcohol use. Caffeine use Coffee, Tea. No drug use Tobacco use Former smoker.  Family History Multimedia programmer, Therapist, sports, BSN; 08/30/2014 1:52 PM) Family history unknown First Degree Relatives     Review of Systems Occupational hygienist, BSN; 08/30/2014 1:52 PM) General Not Present- Appetite Loss, Chills, Fatigue, Fever, Night Sweats, Weight Gain and Weight Loss. Skin Not Present- Change in Wart/Mole, Dryness, Hives, Jaundice, New Lesions, Non-Healing Wounds, Rash and Ulcer. HEENT Present- Wears glasses/contact lenses. Not Present- Earache, Hearing Loss, Hoarseness, Nose Bleed, Oral Ulcers, Ringing in the Ears, Seasonal Allergies, Sinus Pain, Sore Throat, Visual Disturbances and Yellow Eyes. Respiratory Present- Snoring. Not  Present- Bloody sputum, Chronic Cough, Difficulty Breathing and Wheezing. Cardiovascular Not Present- Chest Pain, Difficulty Breathing Lying Down, Leg Cramps, Palpitations, Rapid Heart Rate, Shortness of Breath and Swelling of Extremities. Gastrointestinal Not Present- Abdominal Pain, Bloating, Bloody Stool, Change in Bowel Habits, Chronic diarrhea, Constipation, Difficulty Swallowing, Excessive gas, Gets full quickly at meals, Hemorrhoids, Indigestion, Nausea, Rectal Pain and Vomiting. Male Genitourinary Present- Change in Urinary Stream and Frequency. Not Present- Blood in Urine, Impotence, Nocturia, Painful Urination, Urgency and Urine Leakage. Musculoskeletal Present- Back Pain and Joint Pain. Not Present- Joint Stiffness, Muscle Pain, Muscle Weakness and Swelling of Extremities. Neurological Present- Headaches. Not Present- Decreased Memory, Fainting, Numbness, Seizures, Tingling, Tremor, Trouble walking and Weakness. Psychiatric Not Present- Anxiety, Bipolar, Change in Sleep Pattern, Depression, Fearful and Frequent crying. Endocrine Present- Cold Intolerance. Not Present- Excessive Hunger, Hair Changes, Heat Intolerance, Hot flashes and New Diabetes.  Vitals Occupational hygienist, BSN; 08/30/2014 1:53 PM) 08/30/2014 1:52 PM Weight: 167.4 lb Height: 70in Body Surface Area: 1.94 m Body Mass Index: 24.02 kg/m Temp.: 98.48F(Oral)  Pulse: 68 (Regular)  Resp.: 18 (Unlabored)  BP: 122/62 (Sitting, Left Arm, Standard)     Physical Exam Cory Hollingshead MD; 08/30/2014 3:01 PM)  The physical exam findings are as follows: Note:General: WDWN in NAD. Pleasant and cooperative.  CV: RRR, no murmur, no JVD.  CHEST: Breath sounds equal and clear. Respirations nonlabored..  ABDOMEN: Soft, nontender, no masses.  GU: Large, reducible left inguinal bulge; no right inguinal bulge; no testicular masses  NEUROLOGIC: Alert and oriented, answers questions appropriately, normal gait and  station.  PSYCHIATRIC: Normal mood, affect , and behavior.    Assessment & Plan Cory Hollingshead MD; 08/30/2014 3:02 PM)  LEFT INGUINAL HERNIA (550.90  K40.90) Impression: This  is getting larger and is symptomatic.  Plan: I recommended open repair with mesh and he is agreeable to this. He does have a colonoscopy scheduled for early March and we discussed doing the hernia repair after the colonoscopy. I have explained the procedure, risks, and aftercare of inguinal hernia repair. Risks include but are not limited to bleeding, infection, wound problems, anesthesia, recurrence, bladder or intestine injury, urinary retention, testicular dysfunction, chronic pain, mesh problems. He seems to understand and agrees to proceed. If he is unable to find help at home, he may need to stay one night in the hospital.  Current Plans Free Text Instructions : discussed with patient and provided information. Follow up as needed Schedule for Surgery  Cory Confer, MD

## 2014-09-01 ENCOUNTER — Encounter: Payer: Self-pay | Admitting: Medical

## 2014-09-02 ENCOUNTER — Encounter: Payer: Self-pay | Admitting: Medical

## 2014-09-02 ENCOUNTER — Ambulatory Visit (HOSPITAL_BASED_OUTPATIENT_CLINIC_OR_DEPARTMENT_OTHER)
Admission: RE | Admit: 2014-09-02 | Discharge: 2014-09-02 | Disposition: A | Discharge status: Home / Self Care | Payer: Medicare HMO | Source: Ambulatory Visit | Attending: Medical | Admitting: Medical

## 2014-09-02 DIAGNOSIS — I700 Atherosclerosis of aorta: Secondary | ICD-10-CM | POA: Insufficient documentation

## 2014-09-02 DIAGNOSIS — I2510 Atherosclerotic heart disease of native coronary artery without angina pectoris: Secondary | ICD-10-CM | POA: Insufficient documentation

## 2014-09-02 DIAGNOSIS — J439 Emphysema, unspecified: Secondary | ICD-10-CM | POA: Diagnosis not present

## 2014-09-02 DIAGNOSIS — Z122 Encounter for screening for malignant neoplasm of respiratory organs: Secondary | ICD-10-CM | POA: Diagnosis not present

## 2014-09-02 DIAGNOSIS — Z87891 Personal history of nicotine dependence: Secondary | ICD-10-CM | POA: Insufficient documentation

## 2014-09-02 DIAGNOSIS — Z136 Encounter for screening for cardiovascular disorders: Secondary | ICD-10-CM | POA: Diagnosis present

## 2014-09-06 ENCOUNTER — Other Ambulatory Visit: Payer: Self-pay

## 2014-09-06 MED ORDER — SIMVASTATIN 20 MG PO TABS: 20.0000 mg | ORAL_TABLET | Freq: Every day | ORAL | Status: DC

## 2014-09-30 ENCOUNTER — Ambulatory Visit (AMBULATORY_SURGERY_CENTER): Payer: Self-pay | Admitting: *Deleted

## 2014-09-30 VITALS — Ht 70.0 in | Wt 168.8 lb

## 2014-09-30 DIAGNOSIS — Z1211 Encounter for screening for malignant neoplasm of colon: Secondary | ICD-10-CM

## 2014-09-30 MED ORDER — MOVIPREP 100 G PO SOLR
1.0000 | Freq: Once | ORAL | Status: DC
Start: 2014-09-30 — End: 2014-10-14

## 2014-09-30 NOTE — Progress Notes (Signed)
No egg or soy allergy No diet pills No home 02 use No past hx of any sedation Pt declined emmi video Pt states he may have an issue with his care partner at the time he is scheduled so he may have to call and  RS. He wasn't aware that the care partner had to stay the whole 2-3 hour either so this may aso be an issue. Gave pt the appointment mate info as well as the [facility] pamphlet and pt states he will work it out but has the number to call if necessary Pt has an inguinal hernia surgery scheduled for 10-18-2014 but has no past sedation

## 2014-10-08 ENCOUNTER — Ambulatory Visit: Payer: Medicare HMO | Attending: Audiology | Admitting: Audiology

## 2014-10-08 DIAGNOSIS — H905 Unspecified sensorineural hearing loss: Secondary | ICD-10-CM

## 2014-10-08 NOTE — Procedures (Signed)
   [facility] [address] [place], [place]  [address] [place] EVALUATION  Patient Name: Cory Willis  Medical Record Number:  [id number] Date of Birth:  22-Nov-1950     Date of Test:  10/08/2014  HISTORY:  Cory Willis, a  64 y.o. old male was seen for audiological evaluation upon referral of Saguier, Gopal, [place].   The patient reported a gradual decrease of hearing for the past 15 years. He denies middle ear pathology, tinnitus, facial numbness or vertigo, but does report a familial history of hearing loss (brother).  Further, [name] has been exposed to occupational and recreational noise exposure all of his life without the use of ear protection.  His chief complaint is difficulty understanding conversational speech.   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  Speech reception thresholds were consistent with the pure tone results indicative of good test reliability.  Speech recognition testing was conducted in each ear independently, at a comfortable listening level (70-75dBHL) and indicated 100% and 100% in the right and left ears respectively.  Speech recognition abilities while in the presence of a soft background noise (s/n+5) were also evaluated.  Under this condition, scores were 76% in the right and 64% in the left ear.   Impedance audiometry was utilized and normal Type A Tympanograms were obtained on both sides supporting good middle ear function.   Acoustic reflexes were tested from 500Hz  - 4000Hz  and were present on the right side and present on the left side.  Distortion Product Otoacoustic Emissions were tested from 2000Hz  - 10000Hz  and were weak or absent on the right side and absent on the left side, indicative of poor outer hair cell function within in the inner  ear.  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  Good middle ear function bilaterally.  RECOMMENDATIONS:    1. Annual audiological re-evaluations  2. [name] is a good hearing aid candidate and therefore a hearing aid assessment/fitting  is recommended.   3. In addition, there are also ways to improve one's listening environment. Such as:  (A) When in a restaurant, sit with your back against a perimeter wall, thus reducing the extraneous noise.   (B) When in meetings, do not sit near an open doorway and position yourself within 8-10ft of the speaker. Front and center is typically best.  (C) Eliminate background noises at home such as the television, dishwasher, etc when having important conversations.  (D) Ask family members to face you when they speak and not to drop their heads or turn and walkway while still speaking.   4. Because [name] has a weakened auditory system he is more at risk from damage of noise exposure than someone with a healthy auditory system and therefore should avoid excessive nose exposure if possible or certainly utilize ear protection.     Ivonne Andrew Pugh, Au.D. Doctor of Audiology CCC-A

## 2014-10-08 NOTE — Patient Instructions (Signed)
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  Good middle ear function bilaterally.  RECOMMENDATIONS:    1. Annual audiological re-evaluations  2. Mr. Lewellen is a good hearing aid candidate and therefore a hearing aid assessment/fitting  is recommended.   3. In addition, there are also ways to improve one's listening environment. Such as:  (A) When in a restaurant, sit with your back against a perimeter wall, thus reducing the extraneous noise.   (B) When in meetings, do not sit near an open doorway and position yourself within 8-10ft of the speaker. Front and center is typically best.  (C) Eliminate background noises at home such as the television, dishwasher, etc when having important conversations.  (D) Ask family members to face you when they speak and not to drop their heads or turn and walkway while still speaking.   4. Because Mr. Weissinger has a weakened auditory system he is more at risk from damage of noise exposure than someone with a healthy auditory system and therefore should avoid excessive nose exposure if possible or certainly utilize ear protection.     Ivonne Andrew Pugh, Au.D. Doctor of Audiology CCC-A

## 2014-10-08 NOTE — Pre-Procedure Instructions (Signed)
Jakobee Brackins  10/08/2014   Your procedure is scheduled on:  Mon, Mar 14 @ 9:30 AM  Report to [facility] at 7:30 AM.  Call this number if you have problems the morning of surgery: [phone]   Remember:   Do not eat food or drink liquids after midnight.   Take these medicines the morning of surgery with A SIP OF WATER:                 No Goody's,BC's,Aleve,Aspirin,Ibuprofen,Fish Oil,or any Herbal Medications.   Do not wear jewelry.  Do not wear lotions, powders, or colognes. You may wear deodorant.  Men may shave face and neck.  Do not bring valuables to the hospital.  [facility] is not responsible                  for any belongings or valuables.               Contacts, dentures or bridgework may not be worn into surgery.  Leave suitcase in the car. After surgery it may be brought to your room.  For patients admitted to the hospital, discharge time is determined by your                treatment team.               Patients discharged the day of surgery will not be allowed to drive  home.    Special Instructions:  [facility] - Preparing for Surgery  Before surgery, you can play an important role.  Because skin is not sterile, your skin needs to be as free of germs as possible.  You can reduce the number of germs on you skin by washing with CHG (chlorahexidine gluconate) soap before surgery.  CHG is an antiseptic cleaner which kills germs and bonds with the skin to continue killing germs even after washing.  Please DO NOT use if you have an allergy to CHG or antibacterial soaps.  If your skin becomes reddened/irritated stop using the CHG and inform your nurse when you arrive at Short Stay.  Do not shave (including legs and underarms) for at least 48 hours prior to the first CHG shower.  You may shave your face.  Please follow these instructions carefully:   1.  Shower with CHG Soap the night before surgery and the                                morning of Surgery.  2.   If you choose to wash your hair, wash your hair first as usual with your       normal shampoo.  3.  After you shampoo, rinse your hair and body thoroughly to remove the                      Shampoo.  4.  Use CHG as you would any other liquid soap.  You can apply chg directly       to the skin and wash gently with scrungie or a clean washcloth.  5.  Apply the CHG Soap to your body ONLY FROM THE NECK DOWN.        Do not use on open wounds or open sores.  Avoid contact with your eyes,       ears, mouth and genitals (private parts).  Wash genitals (private parts)  with your normal soap.  6.  Wash thoroughly, paying special attention to the area where your surgery        will be performed.  7.  Thoroughly rinse your body with warm water from the neck down.  8.  DO NOT shower/wash with your normal soap after using and rinsing off       the CHG Soap.  9.  Pat yourself dry with a clean towel.            10.  Wear clean pajamas.            11.  Place clean sheets on your bed the night of your first shower and do not        sleep with pets.  Day of Surgery  Do not apply any lotions/deoderants the morning of surgery.  Please wear clean clothes to the hospital/surgery center.    Please read over the following fact sheets that you were given: Pain Booklet, Coughing and Deep Breathing and Surgical Site Infection Prevention

## 2014-10-11 ENCOUNTER — Encounter (HOSPITAL_COMMUNITY): Payer: Self-pay

## 2014-10-11 ENCOUNTER — Encounter (HOSPITAL_COMMUNITY)
Admission: RE | Admit: 2014-10-11 | Discharge: 2014-10-11 | Disposition: A | Discharge status: Home / Self Care | Payer: Medicare HMO | Source: Ambulatory Visit | Attending: General Surgery | Admitting: General Surgery

## 2014-10-11 DIAGNOSIS — Z01812 Encounter for preprocedural laboratory examination: Secondary | ICD-10-CM | POA: Insufficient documentation

## 2014-10-11 DIAGNOSIS — K4090 Unilateral inguinal hernia, without obstruction or gangrene, not specified as recurrent: Secondary | ICD-10-CM | POA: Diagnosis not present

## 2014-10-11 HISTORY — DX: Unspecified osteoarthritis, unspecified site: M19.90

## 2014-10-11 LAB — COMPREHENSIVE METABOLIC PANEL
ALT: 18 U/L (ref 0–53)
AST: 41 U/L — ABNORMAL HIGH (ref 0–37)
Albumin: 4.1 g/dL (ref 3.5–5.2)
Alkaline Phosphatase: 56 U/L (ref 39–117)
Anion gap: 3 — ABNORMAL LOW (ref 5–15)
BUN: 12 mg/dL (ref 6–23)
CALCIUM: 10.1 mg/dL (ref 8.4–10.5)
CO2: 29 mmol/L (ref 19–32)
Chloride: 104 mmol/L (ref 96–112)
Creatinine, Ser: 0.78 mg/dL (ref 0.50–1.35)
GLUCOSE: 112 mg/dL — AB (ref 70–99)
Potassium: 4.5 mmol/L (ref 3.5–5.1)
SODIUM: 136 mmol/L (ref 135–145)
Total Bilirubin: 0.9 mg/dL (ref 0.3–1.2)
Total Protein: 7.4 g/dL (ref 6.0–8.3)

## 2014-10-11 LAB — CBC WITH DIFFERENTIAL/PLATELET
Basophils Absolute: 0 10*3/uL (ref 0.0–0.1)
Basophils Relative: 0 % (ref 0–1)
Eosinophils Absolute: 0.2 10*3/uL (ref 0.0–0.7)
Eosinophils Relative: 3 % (ref 0–5)
HCT: 44.9 % (ref 39.0–52.0)
HEMOGLOBIN: 15.4 g/dL (ref 13.0–17.0)
LYMPHS ABS: 1.4 10*3/uL (ref 0.7–4.0)
Lymphocytes Relative: 22 % (ref 12–46)
MCH: 30.1 pg (ref 26.0–34.0)
MCHC: 34.3 g/dL (ref 30.0–36.0)
MCV: 87.9 fL (ref 78.0–100.0)
MONOS PCT: 13 % — AB (ref 3–12)
Monocytes Absolute: 0.8 10*3/uL (ref 0.1–1.0)
NEUTROS ABS: 3.8 10*3/uL (ref 1.7–7.7)
NEUTROS PCT: 62 % (ref 43–77)
PLATELETS: 300 10*3/uL (ref 150–400)
RBC: 5.11 MIL/uL (ref 4.22–5.81)
RDW: 12.9 % (ref 11.5–15.5)
WBC: 6.2 10*3/uL (ref 4.0–10.5)

## 2014-10-11 LAB — PROTIME-INR
INR: 1.05 (ref 0.00–1.49)
PROTHROMBIN TIME: 13.8 s (ref 11.6–15.2)

## 2014-10-11 NOTE — Progress Notes (Signed)
Pt. Informed not to take any aspirin from here to surgery date. Pt. Agrees.

## 2014-10-14 ENCOUNTER — Encounter: Payer: Self-pay | Admitting: Internal Medicine

## 2014-10-14 ENCOUNTER — Ambulatory Visit (AMBULATORY_SURGERY_CENTER): Payer: Medicare HMO | Admitting: Internal Medicine

## 2014-10-14 VITALS — BP 112/77 | HR 59 | Temp 96.6°F | Resp 16 | Ht 70.0 in | Wt 168.0 lb

## 2014-10-14 DIAGNOSIS — Z1211 Encounter for screening for malignant neoplasm of colon: Secondary | ICD-10-CM

## 2014-10-14 MED ORDER — SODIUM CHLORIDE 0.9 % IV SOLN: 500.0000 mL | INTRAVENOUS | Status: DC

## 2014-10-14 NOTE — Patient Instructions (Signed)
YOU HAD AN ENDOSCOPIC PROCEDURE TODAY AT THE [facility]:   Refer to the procedure report that was given to you for any specific questions about what was found during the examination.  If the procedure report does not answer your questions, please call your gastroenterologist to clarify.  If you requested that your care partner not be given the details of your procedure findings, then the procedure report has been included in a sealed envelope for you to review at your convenience later.  YOU SHOULD EXPECT: Some feelings of bloating in the abdomen. Passage of more gas than usual.  Walking can help get rid of the air that was put into your GI tract during the procedure and reduce the bloating. If you had a lower endoscopy (such as a colonoscopy or flexible sigmoidoscopy) you may notice spotting of blood in your stool or on the toilet paper. If you underwent a bowel prep for your procedure, you may not have a normal bowel movement for a few days.  Please Note:  You might notice some irritation and congestion in your nose or some drainage.  This is from the oxygen used during your procedure.  There is no need for concern and it should clear up in a day or so.  SYMPTOMS TO REPORT IMMEDIATELY:   Following lower endoscopy (colonoscopy or flexible sigmoidoscopy):  Excessive amounts of blood in the stool  Significant tenderness or worsening of abdominal pains  Swelling of the abdomen that is new, acute  Fever of 100F or higher   For urgent or emergent issues, a gastroenterologist can be reached at any hour by calling [phone].   DIET: Your first meal following the procedure should be a small meal and then it is ok to progress to your normal diet. Heavy or fried foods are harder to digest and may make you feel nauseous or bloated.  Likewise, meals heavy in dairy and vegetables can increase bloating.  Drink plenty of fluids but you should avoid alcoholic beverages for 24  hours.  ACTIVITY:  You should plan to take it easy for the rest of today and you should NOT DRIVE or use heavy machinery until tomorrow (because of the sedation medicines used during the test).    FOLLOW UP: Our staff will call the number listed on your records the next business day following your procedure to check on you and address any questions or concerns that you may have regarding the information given to you following your procedure. If we do not reach you, we will leave a message.  However, if you are feeling well and you are not experiencing any problems, there is no need to return our call.  We will assume that you have returned to your regular daily activities without incident.  If any biopsies were taken you will be contacted by phone or by letter within the next 1-3 weeks.  Please call us at [phone] if you have not heard about the biopsies in 3 weeks.    SIGNATURES/CONFIDENTIALITY: You and/or your care partner have signed paperwork which will be entered into your electronic medical record.  These signatures attest to the fact that that the information above on your After Visit Summary has been reviewed and is understood.  Full responsibility of the confidentiality of this discharge information lies with you and/or your care-partner. 

## 2014-10-14 NOTE — Progress Notes (Signed)
Report to PACU, RN, vss, BBS= Clear.  

## 2014-10-14 NOTE — Op Note (Signed)
[facility]  [address] [place], [id number]   COLONOSCOPY PROCEDURE REPORT  PATIENT: Cory Willis, Cory Willis  MR#: [id number] BIRTHDATE: Apr 27, 1951 , 66  yrs. old GENDER: male ENDOSCOPIST: Eustace Quail, MD REFERRED BY:. [name] ,[facility] PROCEDURE DATE:  10/14/2014 PROCEDURE:   Colonoscopy, screening First Screening Colonoscopy - Avg.  risk and is 50 yrs.  old or older Yes.  Prior Negative Screening - Now for repeat screening. N/A  History of Adenoma - Now for follow-up colonoscopy & has been > or = to 3 yrs.  N/A  Polyps Removed Today ASA CLASS:   Class II INDICATIONS:Screening for colonic neoplasia and Average Risk. MEDICATIONS: Monitored anesthesia care and Propofol 220 mg IV  DESCRIPTION OF PROCEDURE:   After the risks benefits and alternatives of the procedure were thoroughly explained, informed consent was obtained.  The digital rectal exam revealed no abnormalities of the rectum.   The LB [id number]  endoscope was introduced through the anus and advanced to the cecum, which was identified by both the appendix and ileocecal valve. No adverse events experienced.   The quality of the prep was excellent. (MoviPrep was used)  The instrument was then slowly withdrawn as the colon was fully examined.  COLON FINDINGS: There was moderate diverticulosis noted in the sigmoid colon.   The examination was otherwise normal.  Retroflexed views revealed no abnormalities. The time to cecum = 12.3 Withdrawal time = 6.5   The scope was withdrawn and the procedure completed. COMPLICATIONS: There were no immediate complications.  ENDOSCOPIC IMPRESSION: 1.   Moderate diverticulosis was noted in the sigmoid colon 2.   The examination was otherwise normal  RECOMMENDATIONS: 1. Continue current colorectal screening recommendations for "routine risk" patients with a repeat colonoscopy in 10 years.  eSigned:  Eustace Quail, MD 10/14/2014 9:05 AM   cc: The Patient,  Chauncey Fischer and [name], MD

## 2014-10-15 ENCOUNTER — Telehealth: Payer: Self-pay | Admitting: *Deleted

## 2014-10-15 NOTE — Telephone Encounter (Signed)
  Follow up Call-  Call back number 10/14/2014  Post procedure Call Back phone  # [phone] cell  Permission to leave phone message No     Patient questions:  Do you have a fever, pain , or abdominal swelling? No. Pain Score  0 *  Have you tolerated food without any problems? Yes.    Have you been able to return to your normal activities? Yes.    Do you have any questions about your discharge instructions: Diet   No. Medications  No. Follow up visit  No.  Do you have questions or concerns about your Care? No.  Actions: * If pain score is 4 or above: No action needed, pain <4.

## 2014-10-17 MED ORDER — CEFAZOLIN SODIUM-DEXTROSE 2-3 GM-% IV SOLR
2.0000 g | INTRAVENOUS | Status: AC
  Administered 2014-10-18: 2 g via INTRAVENOUS
  Filled 2014-10-17: qty 50

## 2014-10-18 ENCOUNTER — Ambulatory Visit (HOSPITAL_COMMUNITY): Payer: Medicare HMO | Admitting: Anesthesiology

## 2014-10-18 ENCOUNTER — Ambulatory Visit (HOSPITAL_COMMUNITY)
Admission: RE | Admit: 2014-10-18 | Discharge: 2014-10-19 | Disposition: A | Discharge status: Home / Self Care | Payer: Medicare HMO | Source: Ambulatory Visit | Attending: General Surgery | Admitting: General Surgery

## 2014-10-18 ENCOUNTER — Encounter (HOSPITAL_COMMUNITY): Payer: Self-pay | Admitting: Anesthesiology

## 2014-10-18 ENCOUNTER — Encounter (HOSPITAL_COMMUNITY)
Admission: RE | Disposition: A | Discharge status: Home / Self Care | Payer: Self-pay | Source: Ambulatory Visit | Attending: General Surgery

## 2014-10-18 DIAGNOSIS — Z7982 Long term (current) use of aspirin: Secondary | ICD-10-CM | POA: Diagnosis not present

## 2014-10-18 DIAGNOSIS — K4090 Unilateral inguinal hernia, without obstruction or gangrene, not specified as recurrent: Secondary | ICD-10-CM | POA: Diagnosis present

## 2014-10-18 DIAGNOSIS — Z9849 Cataract extraction status, unspecified eye: Secondary | ICD-10-CM | POA: Diagnosis not present

## 2014-10-18 DIAGNOSIS — Z87891 Personal history of nicotine dependence: Secondary | ICD-10-CM | POA: Diagnosis not present

## 2014-10-18 DIAGNOSIS — M19019 Primary osteoarthritis, unspecified shoulder: Secondary | ICD-10-CM | POA: Diagnosis not present

## 2014-10-18 HISTORY — PX: INGUINAL HERNIA REPAIR: SHX194

## 2014-10-18 HISTORY — PX: INSERTION OF MESH: SHX5868

## 2014-10-18 SURGERY — REPAIR, HERNIA, INGUINAL, ADULT
Anesthesia: General | Site: Groin | Laterality: Left

## 2014-10-18 MED ORDER — FENTANYL CITRATE 0.05 MG/ML IJ SOLN
INTRAMUSCULAR | Status: DC | PRN
  Administered 2014-10-18 (×4): 50 ug via INTRAVENOUS

## 2014-10-18 MED ORDER — LACTATED RINGERS IV SOLN
INTRAVENOUS | Status: DC
  Administered 2014-10-18: 30 mL/h via INTRAVENOUS

## 2014-10-18 MED ORDER — STERILE WATER FOR INJECTION IJ SOLN
INTRAMUSCULAR | Status: AC
  Filled 2014-10-18: qty 10

## 2014-10-18 MED ORDER — EPHEDRINE SULFATE 50 MG/ML IJ SOLN
INTRAMUSCULAR | Status: DC | PRN
  Administered 2014-10-18 (×2): 10 mg via INTRAVENOUS

## 2014-10-18 MED ORDER — OXYCODONE HCL 5 MG/5ML PO SOLN: 5.0000 mg | Freq: Once | ORAL | Status: DC | PRN

## 2014-10-18 MED ORDER — HYDROMORPHONE HCL 1 MG/ML IJ SOLN: 0.2500 mg | INTRAMUSCULAR | Status: DC | PRN

## 2014-10-18 MED ORDER — LACTATED RINGERS IV SOLN
INTRAVENOUS | Status: DC
  Administered 2014-10-18: 50 mL/h via INTRAVENOUS

## 2014-10-18 MED ORDER — HEPARIN SODIUM (PORCINE) 5000 UNIT/ML IJ SOLN
5000.0000 [IU] | Freq: Three times a day (TID) | INTRAMUSCULAR | Status: DC
  Administered 2014-10-19: 5000 [IU] via SUBCUTANEOUS
  Filled 2014-10-18: qty 1

## 2014-10-18 MED ORDER — OXYCODONE HCL 5 MG PO TABS: 5.0000 mg | ORAL_TABLET | ORAL | Status: DC | PRN

## 2014-10-18 MED ORDER — MORPHINE SULFATE 2 MG/ML IJ SOLN: 2.0000 mg | INTRAMUSCULAR | Status: DC | PRN

## 2014-10-18 MED ORDER — LIDOCAINE HCL (CARDIAC) 20 MG/ML IV SOLN
INTRAVENOUS | Status: AC
  Filled 2014-10-18: qty 5

## 2014-10-18 MED ORDER — 0.9 % SODIUM CHLORIDE (POUR BTL) OPTIME
TOPICAL | Status: DC | PRN
  Administered 2014-10-18: 1000 mL

## 2014-10-18 MED ORDER — BUPIVACAINE-EPINEPHRINE 0.5% -1:200000 IJ SOLN
INTRAMUSCULAR | Status: DC | PRN
  Administered 2014-10-18: 30 mL

## 2014-10-18 MED ORDER — MIDAZOLAM HCL 2 MG/2ML IJ SOLN
INTRAMUSCULAR | Status: AC
  Filled 2014-10-18: qty 2

## 2014-10-18 MED ORDER — ONDANSETRON HCL 4 MG/2ML IJ SOLN
INTRAMUSCULAR | Status: AC
Start: 2014-10-18 — End: 2014-10-18
  Filled 2014-10-18: qty 2

## 2014-10-18 MED ORDER — EPHEDRINE SULFATE 50 MG/ML IJ SOLN
INTRAMUSCULAR | Status: AC
  Filled 2014-10-18: qty 1

## 2014-10-18 MED ORDER — LACTATED RINGERS IV SOLN
INTRAVENOUS | Status: DC | PRN
  Administered 2014-10-18 (×2): via INTRAVENOUS

## 2014-10-18 MED ORDER — FENTANYL CITRATE 0.05 MG/ML IJ SOLN
INTRAMUSCULAR | Status: AC
  Filled 2014-10-18: qty 5

## 2014-10-18 MED ORDER — PROMETHAZINE HCL 25 MG/ML IJ SOLN: 6.2500 mg | INTRAMUSCULAR | Status: DC | PRN

## 2014-10-18 MED ORDER — MIDAZOLAM HCL 5 MG/5ML IJ SOLN
INTRAMUSCULAR | Status: DC | PRN
  Administered 2014-10-18 (×2): 1 mg via INTRAVENOUS

## 2014-10-18 MED ORDER — ONDANSETRON HCL 4 MG/2ML IJ SOLN: 4.0000 mg | Freq: Four times a day (QID) | INTRAMUSCULAR | Status: DC | PRN

## 2014-10-18 MED ORDER — BUPIVACAINE HCL (PF) 0.75 % IJ SOLN
INTRAMUSCULAR | Status: DC | PRN
  Administered 2014-10-18: 14 mg via INTRATHECAL

## 2014-10-18 MED ORDER — BUPIVACAINE-EPINEPHRINE (PF) 0.5% -1:200000 IJ SOLN
INTRAMUSCULAR | Status: AC
  Filled 2014-10-18: qty 30

## 2014-10-18 MED ORDER — OXYCODONE HCL 5 MG PO TABS: 5.0000 mg | ORAL_TABLET | Freq: Once | ORAL | Status: DC | PRN

## 2014-10-18 MED ORDER — ONDANSETRON HCL 4 MG PO TABS: 4.0000 mg | ORAL_TABLET | Freq: Four times a day (QID) | ORAL | Status: DC | PRN

## 2014-10-18 MED ORDER — PROPOFOL 10 MG/ML IV BOLUS
INTRAVENOUS | Status: AC
  Filled 2014-10-18: qty 20

## 2014-10-18 MED ORDER — PROPOFOL 10 MG/ML IV BOLUS
INTRAVENOUS | Status: DC | PRN
  Administered 2014-10-18 (×2): 100 mg via INTRAVENOUS

## 2014-10-18 MED ORDER — PROPOFOL INFUSION 10 MG/ML OPTIME
INTRAVENOUS | Status: DC | PRN
  Administered 2014-10-18: 25 ug/kg/min via INTRAVENOUS

## 2014-10-18 SURGICAL SUPPLY — 49 items
BENZOIN TINCTURE PRP APPL 2/3 (GAUZE/BANDAGES/DRESSINGS) ×2 IMPLANT
BLADE SURG 10 STRL SS (BLADE) ×2 IMPLANT
BLADE SURG 15 STRL LF DISP TIS (BLADE) ×1 IMPLANT
BLADE SURG 15 STRL SS (BLADE) ×1
BLADE SURG ROTATE 9660 (MISCELLANEOUS) ×2 IMPLANT
CHLORAPREP W/TINT 26ML (MISCELLANEOUS) ×2 IMPLANT
COVER SURGICAL LIGHT HANDLE (MISCELLANEOUS) ×2 IMPLANT
DRAIN PENROSE 1/2X12 LTX STRL (WOUND CARE) ×2 IMPLANT
DRAPE INCISE IOBAN 66X45 STRL (DRAPES) ×2 IMPLANT
DRAPE LAPAROTOMY TRNSV 102X78 (DRAPE) ×2 IMPLANT
DRAPE UTILITY XL STRL (DRAPES) ×2 IMPLANT
DRSG TEGADERM 4X4.75 (GAUZE/BANDAGES/DRESSINGS) ×2 IMPLANT
DRSG TELFA 3X8 NADH (GAUZE/BANDAGES/DRESSINGS) ×2 IMPLANT
ELECT CAUTERY BLADE 6.4 (BLADE) ×2 IMPLANT
ELECT REM PT RETURN 9FT ADLT (ELECTROSURGICAL) ×2
ELECTRODE REM PT RTRN 9FT ADLT (ELECTROSURGICAL) ×1 IMPLANT
GAUZE SPONGE 4X4 16PLY XRAY LF (GAUZE/BANDAGES/DRESSINGS) ×2 IMPLANT
GLOVE BIO SURGEON STRL SZ7.5 (GLOVE) ×2 IMPLANT
GLOVE BIOGEL PI IND STRL 7.0 (GLOVE) ×2 IMPLANT
GLOVE BIOGEL PI IND STRL 7.5 (GLOVE) ×1 IMPLANT
GLOVE BIOGEL PI IND STRL 8 (GLOVE) ×1 IMPLANT
GLOVE BIOGEL PI INDICATOR 7.0 (GLOVE) ×2
GLOVE BIOGEL PI INDICATOR 7.5 (GLOVE) ×1
GLOVE BIOGEL PI INDICATOR 8 (GLOVE) ×1
GLOVE ECLIPSE 8.0 STRL XLNG CF (GLOVE) ×2 IMPLANT
GLOVE SURG SS PI 7.0 STRL IVOR (GLOVE) ×4 IMPLANT
GOWN STRL REUS W/ TWL LRG LVL3 (GOWN DISPOSABLE) ×4 IMPLANT
GOWN STRL REUS W/TWL LRG LVL3 (GOWN DISPOSABLE) ×4
KIT BASIN OR (CUSTOM PROCEDURE TRAY) ×2 IMPLANT
KIT ROOM TURNOVER OR (KITS) ×2 IMPLANT
MESH HERNIA 3X6 (Mesh General) ×2 IMPLANT
NEEDLE HYPO 25GX1X1/2 BEV (NEEDLE) ×2 IMPLANT
NS IRRIG 1000ML POUR BTL (IV SOLUTION) ×2 IMPLANT
PACK SURGICAL SETUP 50X90 (CUSTOM PROCEDURE TRAY) ×2 IMPLANT
PAD ARMBOARD 7.5X6 YLW CONV (MISCELLANEOUS) ×2 IMPLANT
PENCIL BUTTON HOLSTER BLD 10FT (ELECTRODE) ×2 IMPLANT
SPECIMEN JAR SMALL (MISCELLANEOUS) IMPLANT
SPONGE LAP 18X18 X RAY DECT (DISPOSABLE) ×2 IMPLANT
STRIP CLOSURE SKIN 1/2X4 (GAUZE/BANDAGES/DRESSINGS) ×2 IMPLANT
SUT MON AB 4-0 PC3 18 (SUTURE) ×2 IMPLANT
SUT PROLENE 2 0 CT2 30 (SUTURE) ×4 IMPLANT
SUT SILK 2 0 SH (SUTURE) IMPLANT
SUT VIC AB 2-0 SH 18 (SUTURE) ×2 IMPLANT
SUT VIC AB 3-0 SH 27 (SUTURE) ×2
SUT VIC AB 3-0 SH 27XBRD (SUTURE) ×2 IMPLANT
SUT VICRYL AB 3 0 TIES (SUTURE) ×2 IMPLANT
SYR CONTROL 10ML LL (SYRINGE) ×2 IMPLANT
TOWEL OR 17X24 6PK STRL BLUE (TOWEL DISPOSABLE) IMPLANT
TOWEL OR 17X26 10 PK STRL BLUE (TOWEL DISPOSABLE) ×2 IMPLANT

## 2014-10-18 NOTE — H&P (Signed)
Cory Willis is an 64 y.o. male.   Chief Complaint: Here for elective left inguinal hernia (LIH) repair HPI:  He has had the hernia since 2001. It has slowly been getting larger and is uncomfortable at times. He manually reduces it daily. No difficulty with urination or BMs. He wears hernia underwear. He presents hernia repair.   Past Medical History  Diagnosis Date  . Cataract     Starting  . Substance abuse     Former Patent examiner  . Shortness of breath dyspnea   . Arthritis     shoulders    Past Surgical History  Procedure Laterality Date  . No past surgeries      Family History  Problem Relation Age of Onset  . Heart disease Mother   . Colon cancer Neg Hx   . Rectal cancer Neg Hx   . Stomach cancer Neg Hx   . Esophageal cancer Neg Hx    Social History:  reports that he quit smoking about 7 years ago. His smokeless tobacco use includes Snuff. He reports that he does not drink alcohol or use illicit drugs.  Allergies: No Known Allergies  Medications Prior to Admission  Medication Sig Dispense Refill  . chlorpheniramine (CHLOR-TRIMETON) 4 MG tablet Take 4 mg by mouth every 6 (six) hours as needed for allergies.    . simvastatin (ZOCOR) 20 MG tablet Take 1 tablet (20 mg total) by mouth at bedtime. 30 tablet 3  . aspirin 325 MG tablet Take 650 mg by mouth every 6 (six) hours as needed.      No results found for this or any previous visit (from the past 48 hour(s)). No results found.  Review of Systems  Constitutional: Negative for fever and chills.  Gastrointestinal: Negative for nausea and vomiting.    Blood pressure 148/78, pulse 54, temperature 97.7 F (36.5 C), temperature source Oral, resp. rate 18, weight 168 lb (76.204 kg), SpO2 100 %. Physical Exam  Constitutional: He appears well-developed and well-nourished. No distress.  HENT:  Head: Normocephalic and atraumatic.  Cardiovascular: Normal rate and regular rhythm.   Respiratory: Effort normal and breath  sounds normal.  GI: Soft. There is no tenderness.  Genitourinary:  Moderate size left inguinal bulge  Musculoskeletal: He exhibits no edema.  Neurological: He is alert.     Assessment/Plan Left inguinal hernia  Plan:  Left inguinal hernia repair with mesh.  He has no help at home and so will stay one night in the hospital.  Tou Hayner J 10/18/2014, 8:55 AM

## 2014-10-18 NOTE — Transfer of Care (Signed)
Immediate Anesthesia Transfer of Care Note  Patient: Cory Willis  Procedure(s) Performed: Procedure(s): OPEN LEFT INGUINAL HERNIA REPAIR WITH MESH (Left) INSERTION OF MESH (Left)  Patient Location: PACU  Anesthesia Type:General  Level of Consciousness: awake, alert , oriented and sedated  Airway & Oxygen Therapy: Patient Spontanous Breathing and Patient connected to nasal cannula oxygen  Post-op Assessment: Report given to RN, Post -op Vital signs reviewed and stable and Patient moving all extremities  Post vital signs: Reviewed and stable  Last Vitals:  Filed Vitals:   10/18/14 0757  BP: 148/78  Pulse: 54  Temp: 36.5 C  Resp: 18    Complications: No apparent anesthesia complications

## 2014-10-18 NOTE — Anesthesia Procedure Notes (Addendum)
Spinal Patient location during procedure: OR Start time: 10/18/2014 9:30 AM End time: 10/18/2014 9:30 AM Staffing Anesthesiologist: MASSAGEE, TERRY Performed by: anesthesiologist  Preanesthetic Checklist Completed: patient identified, site marked, surgical consent, pre-op evaluation, timeout performed, IV checked, risks and benefits discussed and monitors and equipment checked Spinal Block Patient position: sitting Prep: ChloraPrep Approach: midline Location: L3-4 Injection technique: single-shot Needle Needle type: Quincke  Needle length: 9 cm Needle insertion depth: 4 cm Assessment Sensory level: T6 Additional Notes Tolerated well  Procedure Name: LMA Insertion Date/Time: 10/18/2014 10:18 AM Performed by: Ebbie Latus E Pre-anesthesia Checklist: Patient identified, Emergency Drugs available, Suction available, Patient being monitored and Timeout performed Patient Re-evaluated:Patient Re-evaluated prior to inductionOxygen Delivery Method: Circle system utilized Preoxygenation: Pre-oxygenation with 100% oxygen Intubation Type: IV induction Ventilation: Mask ventilation without difficulty LMA: LMA inserted LMA Size: 4.0 Number of attempts: 1 Placement Confirmation: positive ETCO2 and breath sounds checked- equal and bilateral Tube secured with: Tape Dental Injury: Teeth and Oropharynx as per pre-operative assessment

## 2014-10-18 NOTE — Anesthesia Preprocedure Evaluation (Signed)
Anesthesia Evaluation  Patient identified by MRN, date of birth, ID band Patient awake    Reviewed: Allergy & Precautions, NPO status , Patient's Chart, lab work & pertinent test results  History of Anesthesia Complications Negative for: history of anesthetic complications  Airway Mallampati: II   Neck ROM: Full    Dental  (+) Teeth Intact   Pulmonary shortness of breath, former smoker,  breath sounds clear to auscultation        Cardiovascular Rhythm:Regular Rate:Normal     Neuro/Psych    GI/Hepatic negative GI ROS, Neg liver ROS,   Endo/Other  negative endocrine ROS  Renal/GU negative Renal ROS     Musculoskeletal  (+) Arthritis -,   Abdominal   Peds  Hematology   Anesthesia Other Findings   Reproductive/Obstetrics                             Anesthesia Physical Anesthesia Plan  ASA: I  Anesthesia Plan: Spinal   Post-op Pain Management:    Induction: Intravenous  Airway Management Planned: Natural Airway  Additional Equipment:   Intra-op Plan:   Post-operative Plan:   Informed Consent: I have reviewed the patients History and Physical, chart, labs and discussed the procedure including the risks, benefits and alternatives for the proposed anesthesia with the patient or authorized representative who has indicated his/her understanding and acceptance.   Dental advisory given  Plan Discussed with: CRNA  Anesthesia Plan Comments:         Anesthesia Quick Evaluation

## 2014-10-18 NOTE — Discharge Instructions (Addendum)
[facility] _______[facility]   INGUINAL HERNIA REPAIR: POST OP INSTRUCTIONS  Always review your discharge instruction sheet given to you by the facility where your surgery was performed. IF YOU HAVE DISABILITY OR FAMILY LEAVE FORMS, YOU MUST BRING THEM TO THE OFFICE FOR PROCESSING.   DO NOT GIVE THEM TO YOUR DOCTOR.  1. A  prescription for pain medication may be given to you upon discharge.  Take your pain medication as prescribed, if needed.  If narcotic pain medicine is not needed, then you may take acetaminophen (Tylenol) or ibuprofen (Advil) as needed. 2. Take your usually prescribed medications unless otherwise directed. 3. If you need a refill on your pain medication, please contact your pharmacy.  They will contact our office to request authorization. Prescriptions will not be filled after 5 pm or on week-ends. 4. You should follow a light diet the first 24 hours after arrival home, such as soup and crackers, etc.  Be sure to include lots of fluids daily.  Resume your normal diet the day after surgery. 5. Most patients will experience some swelling and bruising around the umbilicus or in the groin and scrotum.  Ice packs and reclining will help.  Swelling and bruising can take several days to resolve.  6. It is common to experience some constipation if taking pain medication after surgery.  Increasing fluid intake and taking a stool softener (such as Colace) will usually help or prevent this problem from occurring.  A mild laxative (Milk of Magnesia or Miralax) should be taken according to package directions if there are no bowel movements after 48 hours. 7. Unless discharge instructions indicate otherwise, you may remove your bandages 72 hours after surgery, and you may shower the day afte your surgery.  You may have steri-strips (small skin tapes) in place directly over the incision.  These strips should be left on the skin.  If your surgeon used skin glue on the incision, you may  shower in 24 hours.  The glue will flake off over the next 2-3 weeks.  Any sutures or staples will be removed at the office during your follow-up visit. 8. ACTIVITIES:  You may resume regular (light) daily activities beginning the next day--such as daily self-care, walking, climbing stairs--gradually increasing activities as tolerated.  You may have sexual intercourse when it is comfortable.  Refrain from any heavy lifting or straining-nothing over 10 pounds for 6 weeks.  a. You may drive when you are no longer taking prescription pain medication, you can comfortably wear a seatbelt, and you can safely maneuver your car and apply brakes. b. RETURN TO WORK:  __________________________________________________________ 9. You should see your doctor in the office for a follow-up appointment April 5 at 9:00 AM. 10. OTHER INSTRUCTIONS:  __________________________________________________________________________________________________________________________________________________________________________________________  WHEN TO CALL YOUR DOCTOR: 1. Fever over 101.0 2. Inability to urinate 3. Nausea and/or vomiting 4. Extreme swelling or bruising 5. Continued bleeding from incision. 6. Increased pain, redness, or drainage from the incision  The clinic staff is available to answer your questions during regular business hours.  Please dont hesitate to call and ask to speak to one of the nurses for clinical concerns.  If you have a medical emergency, go to the nearest emergency room or call 911.  A surgeon from [facility] is always on call at the hospital   [address] [place], [place], [place]  [id number] ?  [address], [place], [place]   [address] [phone] ? [phone] ?  FAX [phone] Web site: [email or web address]

## 2014-10-18 NOTE — Interval H&P Note (Signed)
History and Physical Interval Note:  10/18/2014 8:57 AM  Cory Willis  has presented today for surgery, with the diagnosis of LEFT INGUINAL HERNIA  The various methods of treatment have been discussed with the patient and family. After consideration of risks, benefits and other options for treatment, the patient has consented to  Procedure(s): OPEN LEFT INGUINAL HERNIA REPAIR WITH MESH (Left) INSERTION OF MESH (N/A) as a surgical intervention .  The patient's history has been reviewed, patient examined, no change in status, stable for surgery.  I have reviewed the patient's chart and labs.  Questions were answered to the patient's satisfaction.     Ivy Meriwether Lenna Sciara

## 2014-10-18 NOTE — Anesthesia Postprocedure Evaluation (Signed)
  Anesthesia Post-op Note  Patient: Cory Willis  Procedure(s) Performed: Procedure(s): OPEN LEFT INGUINAL HERNIA REPAIR WITH MESH (Left) INSERTION OF MESH (Left)  Patient Location: PACU  Anesthesia Type: Spinal, General   Level of Consciousness: awake, alert  and oriented  Airway and Oxygen Therapy: Patient Spontanous Breathing  Post-op Pain: none  Post-op Assessment: Post-op Vital signs reviewed  Post-op Vital Signs: Reviewed  Last Vitals:  Filed Vitals:   10/18/14 1445  BP:   Pulse: 49  Temp: 36.4 C  Resp: 13    Complications: No apparent anesthesia complications

## 2014-10-18 NOTE — Op Note (Signed)
Preoperative diagnosis:  Left inguinal hernia.  Postoperative diagnosis:  Same (pantaloon hernia)  Procedure:  Left inguinal hernia repair with mesh.  Surgeon:  [name], M.D.  Anesthesia:  Spinal and general with local (Marcaine).  Indication:  This is a 64 year old male with a long standing left inguinal hernia that requires daily manual reduction.  He now presents for repair.  Technique:  He was seen in the holding room and the left groin was marked with my initials.   He was brought to the operating, placed supine on the OR table. A spinal anesthetic was given. The hair in the groin area was clipped as was felt to be necessary. This area was then sterilely prepped and draped.  Local anesthetic was infiltrated in the superficial and deep tissues in the left groin.  An incision was made through the skin and subcutaneous tissue until the external oblique aponeurosis was identified.  Local anesthetic was infiltrated deep to the external oblique aponeurosis. The external oblique aponeurosis was divided through the external ring medially and back toward the anterior superior iliac spine laterally. Using blunt dissection, the shelving edge of the inguinal ligament was identified inferiorly and the internal oblique aponeurosis and muscle were identified superiorly. The ilioinguinal nerve was identified and preserved.  The spermatic cord was isolated and a posterior window was made around it.  A large hernia sac was adherent to the spermatic cord extending into the upper scrotum.  The spinal anesthetic was not adequate so a general anesthetic was given at this time.    Following this, the hernia was reduced and the sac separated from the spermatic cord using blunt dissection.  A pantaloon defect was noted.  The hernia was reduced.   A piece of 3" x 6" polypropylene mesh was brought into the field and anchored 1-2 cm medial to the pubic tubercle with 2-0 Prolene suture. The inferior aspect of the  mesh was anchored to the shelving edge of the inguinal ligament with running 2-0 Prolene suture to a level 1-2 cm lateral to the internal ring. A slit was cut in the mesh creating 2 tails. These were wrapped around the spermatic cord. The superior aspect of the mesh was anchored to the internal oblique aponeurosis and muscle with interrupted 2-0 Vicryl sutures. The 2 tails of the mesh were then crossed creating a new internal ring and were anchored to the shelving edge of the inguinal ligament with 2-0 Prolene suture. The tip of a hemostat could be placed through the new aperture. The lateral aspect of the mesh was then tucked deep to the external oblique aponeurosis.  The wound was inspected and hemostasis was adequate. The external oblique aponeurosis was then closed over the mesh and cord with running 3-0 Vicryl suture. The subcutaneous tissue was closed with running 3-0 Vicryl suture. The skin closed with a running 4-0 Monocryl subcuticular stitch.  Steri-Strips and a sterile dressing were applied.  The procedure was well-tolerated without any apparent complications and he was taken to the recovery room in satisfactory condition.

## 2014-10-19 ENCOUNTER — Encounter (HOSPITAL_COMMUNITY): Payer: Self-pay | Admitting: General Surgery

## 2014-10-19 DIAGNOSIS — K4090 Unilateral inguinal hernia, without obstruction or gangrene, not specified as recurrent: Secondary | ICD-10-CM | POA: Diagnosis not present

## 2014-10-19 NOTE — Progress Notes (Signed)
1 Day Post-Op  Subjective: A little sore.  Voiding.  Objective: Vital signs in last 24 hours: Temp:  [97.6 F (36.4 C)-98.6 F (37 C)] 98.3 F (36.8 C) (03/15 0513) Pulse Rate:  [36-76] 65 (03/15 0513) Resp:  [9-27] 16 (03/15 0513) BP: (120-138)/(62-90) 120/62 mmHg (03/15 0513) SpO2:  [95 %-100 %] 98 % (03/15 0513) Weight:  [76.204 kg (168 lb)] 76.204 kg (168 lb) (03/14 2000) Last BM Date: 10/17/14  Intake/Output from previous day: 03/14 0701 - 03/15 0700 In: 2434.5 [P.O.:600; I.V.:1834.5] Out: 2075 [Urine:2075] Intake/Output this shift:    PE: General- In NAD Left groin-dressing dry, some bruising, minimal swelling  Lab Results:  No results for input(s): WBC, HGB, HCT, PLT in the last 72 hours. BMET No results for input(s): NA, K, CL, CO2, GLUCOSE, BUN, CREATININE, CALCIUM in the last 72 hours. PT/INR No results for input(s): LABPROT, INR in the last 72 hours. Comprehensive Metabolic Panel:    Component Value Date/Time   NA 136 10/11/2014 1001   NA 139 08/26/2014 0938   K 4.5 10/11/2014 1001   K 4.4 08/26/2014 0938   CL 104 10/11/2014 1001   CL 103 08/26/2014 0938   CO2 29 10/11/2014 1001   CO2 31 08/26/2014 0938   BUN 12 10/11/2014 1001   BUN 9 08/26/2014 0938   CREATININE 0.78 10/11/2014 1001   CREATININE 0.81 08/26/2014 0938   GLUCOSE 112* 10/11/2014 1001   GLUCOSE 95 08/26/2014 0938   CALCIUM 10.1 10/11/2014 1001   CALCIUM 10.7* 08/26/2014 0938   AST 41* 10/11/2014 1001   AST 11 08/26/2014 0938   ALT 18 10/11/2014 1001   ALT 8 08/26/2014 0938   ALKPHOS 56 10/11/2014 1001   ALKPHOS 56 08/26/2014 0938   BILITOT 0.9 10/11/2014 1001   BILITOT 0.6 08/26/2014 0938   PROT 7.4 10/11/2014 1001   PROT 6.8 08/26/2014 0938   ALBUMIN 4.1 10/11/2014 1001   ALBUMIN 4.2 08/26/2014 0938     Studies/Results: No results found.  Anti-infectives: Anti-infectives    Start     Dose/Rate Route Frequency Ordered Stop   10/18/14 0600  ceFAZolin (ANCEF) IVPB 2 g/50  mL premix     2 g 100 mL/hr over 30 Minutes Intravenous On call to O.R. 10/17/14 1252 10/18/14 0938      Assessment Active Problems:   Pantaloon left inguinal hernia-stable overnight      Plan: Discharge today.  Instructions given to him.   Cory Willis 10/19/2014

## 2014-10-19 NOTE — Progress Notes (Signed)
Patient given discharge paperwork. Prescription given to patient. Patient is ready for discharge.

## 2014-11-26 ENCOUNTER — Ambulatory Visit (INDEPENDENT_AMBULATORY_CARE_PROVIDER_SITE_OTHER): Payer: Medicare HMO | Admitting: Urology

## 2014-11-26 ENCOUNTER — Other Ambulatory Visit: Payer: Self-pay | Admitting: Urology

## 2014-11-26 DIAGNOSIS — R972 Elevated prostate specific antigen [PSA]: Secondary | ICD-10-CM

## 2014-11-26 DIAGNOSIS — N401 Enlarged prostate with lower urinary tract symptoms: Secondary | ICD-10-CM | POA: Diagnosis not present

## 2014-11-26 DIAGNOSIS — R3912 Poor urinary stream: Secondary | ICD-10-CM | POA: Diagnosis not present

## 2014-12-06 ENCOUNTER — Ambulatory Visit (INDEPENDENT_AMBULATORY_CARE_PROVIDER_SITE_OTHER): Payer: Medicare HMO | Admitting: Medical

## 2014-12-06 ENCOUNTER — Encounter: Payer: Self-pay | Admitting: Medical

## 2014-12-06 ENCOUNTER — Telehealth: Payer: Self-pay | Admitting: Medical

## 2014-12-06 VITALS — BP 133/80 | HR 59 | Temp 98.2°F | Ht 70.0 in | Wt 168.0 lb

## 2014-12-06 DIAGNOSIS — R972 Elevated prostate specific antigen [PSA]: Secondary | ICD-10-CM

## 2014-12-06 DIAGNOSIS — E785 Hyperlipidemia, unspecified: Secondary | ICD-10-CM | POA: Insufficient documentation

## 2014-12-06 HISTORY — DX: Hyperlipidemia, unspecified: E78.5

## 2014-12-06 LAB — LIPID PANEL
Cholesterol: 178 mg/dL (ref 0–200)
HDL: 50.1 mg/dL (ref >39.00)
LDL Cholesterol: 112 mg/dL — ABNORMAL HIGH (ref 0–99)
NONHDL: 127.9
Total CHOL/HDL Ratio: 4
Triglycerides: 82 mg/dL (ref 0.0–149.0)
VLDL: 16.4 mg/dL (ref 0.0–40.0)

## 2014-12-06 LAB — COMPREHENSIVE METABOLIC PANEL
ALBUMIN: 4 g/dL (ref 3.5–5.2)
ALT: 17 U/L (ref 0–53)
AST: 14 U/L (ref 0–37)
Alkaline Phosphatase: 57 U/L (ref 39–117)
BUN: 12 mg/dL (ref 6–23)
CO2: 31 meq/L (ref 19–32)
Calcium: 10.9 mg/dL — ABNORMAL HIGH (ref 8.4–10.5)
Chloride: 104 mEq/L (ref 96–112)
Creatinine, Ser: 0.78 mg/dL (ref 0.40–1.50)
GFR: 105.86 mL/min (ref >60.00)
Glucose, Bld: 94 mg/dL (ref 70–99)
POTASSIUM: 4.1 meq/L (ref 3.5–5.1)
SODIUM: 139 meq/L (ref 135–145)
Total Bilirubin: 0.4 mg/dL (ref 0.2–1.2)
Total Protein: 7.2 g/dL (ref 6.0–8.3)

## 2014-12-06 MED ORDER — SIMVASTATIN 20 MG PO TABS
20.0000 mg | ORAL_TABLET | Freq: Every day | ORAL | Status: DC
Start: 2014-12-06 — End: 2017-08-23

## 2014-12-06 NOTE — Progress Notes (Signed)
Pre visit review using our clinic review tool, if applicable. No additional management support is needed unless otherwise documented below in the visit note. 

## 2014-12-06 NOTE — Patient Instructions (Signed)
Inguinal hernia Repaired since I last saw him. Now doing well.   Increased prostate specific antigen (PSA) velocity Pt will get biopsy with urologist in near future.   Hyperlipidemia Mild ldl elevation. Pt is fasting will get repeat labs today. Pt is on simvastatin.     Follow up in 3 month or as needed.  Will call you on labs and adjust med dosing if necessary

## 2014-12-06 NOTE — Progress Notes (Signed)
Subjective:    Patient ID: Cory Willis, male    DOB: November 01, 1950, 64 y.o.   MRN: [id number]  HPI  Pt in today. Pt updates me that he got inguinal hernia repaired. Also patient has biopsy of prostate set for 22 nd of May. Last psa was 6.22. 2 wks ago his psa was 7.6. Pt is seeing urologist.   Pt wellness exam done in January showed also lipid panel mild elevated. Ldl was 115. Pt admits not compliant on his diet. He increased exercise for couple of weeks. He was working part time but recently quit. But now has more time.    Review of Systems  Constitutional: Negative for fever, chills, diaphoresis, activity change and fatigue.  Respiratory: Negative for cough, chest tightness and shortness of breath.   Cardiovascular: Negative for chest pain, palpitations and leg swelling.  Gastrointestinal: Negative for nausea, vomiting and abdominal pain.  Musculoskeletal: Negative for neck pain and neck stiffness.  Neurological: Negative for dizziness, tremors, seizures, syncope, facial asymmetry, speech difficulty, weakness, light-headedness, numbness and headaches.  Psychiatric/Behavioral: Negative for behavioral problems, confusion and agitation. The patient is not nervous/anxious.      Past Medical History  Diagnosis Date  . Cataract     Starting  . Substance abuse     Former Patent examiner  . Shortness of breath dyspnea   . Arthritis     shoulders    History   Social History  . Marital Status: Divorced    Spouse Name: N/A  . Number of Children: N/A  . Years of Education: N/A   Occupational History  . Not on file.   Social History Main Topics  . Smoking status: Former Smoker    Quit date: 08/20/2007  . Smokeless tobacco: Current User    Types: Snuff  . 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 Drug Use: No  . Sexual Activity: Not on file   Other Topics Concern  . Not on file   Social History Narrative    Past Surgical History  Procedure Laterality Date    . No past surgeries    . Inguinal hernia repair Left 10/18/2014    Procedure: OPEN LEFT INGUINAL HERNIA REPAIR WITH MESH;  Surgeon: [name], MD;  Location: [facility];  Service: General;  Laterality: Left;  . Insertion of mesh Left 10/18/2014    Procedure: INSERTION OF MESH;  Surgeon: [name], MD;  Location: [facility];  Service: General;  Laterality: Left;    Family History  Problem Relation Age of Onset  . Heart disease Mother   . Colon cancer Neg Hx   . Rectal cancer Neg Hx   . Stomach cancer Neg Hx   . Esophageal cancer Neg Hx     No Known Allergies  Current Outpatient Prescriptions on File Prior to Visit  Medication Sig Dispense Refill  . chlorpheniramine (CHLOR-TRIMETON) 4 MG tablet Take 4 mg by mouth every 6 (six) hours as needed for allergies.    . simvastatin (ZOCOR) 20 MG tablet Take 1 tablet (20 mg total) by mouth at bedtime. 30 tablet 3  . aspirin 325 MG tablet Take 650 mg by mouth every 6 (six) hours as needed.    Marland Kitchen oxyCODONE (OXY IR/ROXICODONE) 5 MG immediate release tablet Take 1-2 tablets (5-10 mg total) by mouth every 4 (four) hours as needed for moderate pain, severe pain or breakthrough pain. (Patient not taking: Reported on 12/06/2014) 40 tablet 0   No  current facility-administered medications on file prior to visit.    BP 133/80 mmHg  Pulse 59  Temp(Src) 98.2 F (36.8 C) (Oral)  Ht 5\' 10"  (1.778 m)  Wt 168 lb (76.204 kg)  BMI 24.11 kg/m2  SpO2 98%        Objective:   Physical Exam  General Mental Status- Alert. General Appearance- Not in acute distress.   Skin General: Color- Normal Color. Moisture- Normal Moisture.  Neck Carotid Arteries- Normal color. Moisture- Normal Moisture. No carotid bruits. No JVD.  Chest and Lung Exam Auscultation: Breath Sounds:-Normal.  Cardiovascular Auscultation:Rythm- Regular. Murmurs & Other Heart Sounds:Auscultation of the heart reveals- No Murmurs.   Neurologic Cranial Nerve exam:- CN III-XII  intact(No nystagmus), symmetric smile. Strength:- 5/5 equal and symmetric strength both upper and lower extremities.      Assessment & Plan:

## 2014-12-06 NOTE — Assessment & Plan Note (Addendum)
Mild ldl elevation. Pt is fasting will get repeat labs today. Pt is on simvastatin.

## 2014-12-06 NOTE — Assessment & Plan Note (Signed)
Repaired since I last saw him. Now doing well.

## 2014-12-06 NOTE — Assessment & Plan Note (Signed)
Pt will get biopsy with urologist in near future.

## 2014-12-16 NOTE — Telephone Encounter (Signed)
Appears his med refilled. Not sure why chart was open.

## 2014-12-21 ENCOUNTER — Other Ambulatory Visit: Payer: Self-pay | Admitting: Urology

## 2014-12-21 DIAGNOSIS — R972 Elevated prostate specific antigen [PSA]: Secondary | ICD-10-CM

## 2014-12-31 ENCOUNTER — Ambulatory Visit (HOSPITAL_COMMUNITY)
Admission: RE | Admit: 2014-12-31 | Discharge: 2014-12-31 | Disposition: A | Discharge status: Home / Self Care | Payer: Medicare HMO | Source: Ambulatory Visit | Attending: Urology | Admitting: Urology

## 2014-12-31 ENCOUNTER — Encounter (HOSPITAL_COMMUNITY): Payer: Self-pay

## 2014-12-31 DIAGNOSIS — R972 Elevated prostate specific antigen [PSA]: Secondary | ICD-10-CM

## 2014-12-31 DIAGNOSIS — C61 Malignant neoplasm of prostate: Secondary | ICD-10-CM | POA: Insufficient documentation

## 2014-12-31 MED ORDER — CEFTRIAXONE SODIUM 1 G IJ SOLR: 1.0000 g | INTRAMUSCULAR | Status: DC

## 2014-12-31 MED ORDER — CEFTRIAXONE SODIUM 1 G IJ SOLR
1.0000 g | Freq: Once | INTRAMUSCULAR | Status: AC
  Administered 2014-12-31: 1 g via INTRAMUSCULAR

## 2014-12-31 MED ORDER — LIDOCAINE HCL (PF) 2 % IJ SOLN
INTRAMUSCULAR | Status: AC
  Filled 2014-12-31: qty 10

## 2014-12-31 MED ORDER — LIDOCAINE HCL (PF) 1 % IJ SOLN
INTRAMUSCULAR | Status: AC
  Administered 2014-12-31: 2 mL
  Filled 2014-12-31: qty 5

## 2014-12-31 MED ORDER — CEFTRIAXONE SODIUM 1 G IJ SOLR
INTRAMUSCULAR | Status: AC
  Administered 2014-12-31: 1 g via INTRAMUSCULAR
  Filled 2014-12-31: qty 10

## 2014-12-31 NOTE — Sedation Documentation (Signed)
Procedure complete. Patient tolerated well.

## 2014-12-31 NOTE — Discharge Instructions (Addendum)
Transrectal Ultrasound-Guided Biopsy °A transrectal ultrasound-guided biopsy is a procedure to remove samples of tissue from your prostate using ultrasound images to guide the procedure. The procedure is usually done to evaluate the prostate gland of men who have an elevated prostate-specific antigen (PSA). PSA is a blood test to screen for prostate cancer. The biopsy samples are taken to check for prostate cancer.  °LET YOUR HEALTH CARE PROVIDER KNOW ABOUT: °· Any allergies you have. °· All medicines you are taking, including vitamins, herbs, eye drops, creams, and over-the-counter medicines. °· Previous problems you or members of your family have had with the use of anesthetics. °· Any blood disorders you have. °· Previous surgeries you have had. °· Medical conditions you have. °RISKS AND COMPLICATIONS °Generally, this is a safe procedure. However, as with any procedure, problems can occur. Possible problems include: °· Infection of your prostate. °· Bleeding from your rectum or blood in your urine. °· Difficulty urinating. °· Nerve damage (this is usually temporary). °· Damage to surrounding structures such as blood vessels, organs, and muscles, which would require other procedures. °BEFORE THE PROCEDURE °· Do not eat or drink anything after midnight on the night before the procedure or as directed by your health care provider. °· Take medicines only as directed by your health care provider. °· Your health care provider may have you stop taking certain medicines 5-7 days before the procedure. °· You will be given an enema before the procedure. During an enema, a liquid is injected into your rectum to clear out waste. °· You may have lab tests the day of your procedure.   °· Plan to have someone take you home after the procedure. °PROCEDURE  °· You will be given medicine to help you relax (sedative) before the procedure. An IV tube will be inserted into one of your veins and used to give fluids and  medicine. °· You will be given antibiotic medicine to reduce the risk of an infection. °· You will be placed on your side for the procedure. °· A probe with lubricated gel will be placed into your rectum, and images will be taken of your prostate and surrounding structures. °· Numbing medicine will be injected into the prostate before the biopsy samples are taken. °· A biopsy needle will then be inserted and guided to your prostate with the use of the ultrasound images. °· Samples of prostate tissue will be taken, and the needle will then be removed. °· The biopsy samples will be sent to a lab to be analyzed. Results are usually back in 2-3 days. °AFTER THE PROCEDURE °· You will be taken to a recovery area where you will be monitored. °· You may have some discomfort in the rectal area. You will be given pain medicines to control this. °· You may be allowed to go home the same day, or you may need to stay in the hospital overnight. °Document Released: 12/07/2013 Document Reviewed: 03/11/2013 °[employer]® Patient Information ©2015 [employer]. This information is not intended to replace advice given to you by your health care provider. Make sure you discuss any questions you have with your health care provider. ° °

## 2015-01-26 ENCOUNTER — Encounter: Payer: Self-pay | Admitting: Radiation Oncology

## 2015-01-26 NOTE — Progress Notes (Signed)
GU Location of Tumor / Histology: prostatic adenocarcinoma  If Prostate Cancer, Gleason Score is (3 + 4) and PSA is (7.40)  Cory Willis was referred to Dr. Jeffie Pollock by Ila Mcgill, PA for an elevated PSA of 6.22 on labs done 08/26/14.  Biopsies of prostate (if applicable) revealed:    Past/Anticipated interventions by urology, if any: biopsy and referral to radiation oncology  Past/Anticipated interventions by medical oncology, if any: no  Weight changes, if any: no  Bowel/Bladder complaints, if any: on 11/26/14 reported AM frequency and weaker stream to Dr. Jeffie Pollock following dinner [facility]   Nausea/Vomiting, if any: no  Pain issues, if any:  no  SAFETY ISSUES:  Prior radiation? no  Pacemaker/ICD? no  Possible current pregnancy? no  Is the patient on methotrexate? no  Current Complaints / other details:  64 year old male. No family hx of prostate ca. Prostate volume: 57 cc. NKDA. 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

## 2015-01-27 ENCOUNTER — Ambulatory Visit
Admission: RE | Admit: 2015-01-27 | Discharge: 2015-01-27 | Disposition: A | Discharge status: Home / Self Care | Payer: Medicare HMO | Source: Ambulatory Visit | Attending: Radiation Oncology | Admitting: Radiation Oncology

## 2015-01-27 ENCOUNTER — Encounter: Payer: Self-pay | Admitting: Radiation Oncology

## 2015-01-27 VITALS — BP 107/72 | HR 74 | Temp 98.0°F | Resp 16 | Ht 70.0 in | Wt 170.4 lb

## 2015-01-27 DIAGNOSIS — E785 Hyperlipidemia, unspecified: Secondary | ICD-10-CM | POA: Diagnosis not present

## 2015-01-27 DIAGNOSIS — M1990 Unspecified osteoarthritis, unspecified site: Secondary | ICD-10-CM | POA: Diagnosis not present

## 2015-01-27 DIAGNOSIS — C61 Malignant neoplasm of prostate: Secondary | ICD-10-CM | POA: Diagnosis present

## 2015-01-27 DIAGNOSIS — Z7982 Long term (current) use of aspirin: Secondary | ICD-10-CM | POA: Diagnosis not present

## 2015-01-27 DIAGNOSIS — R3915 Urgency of urination: Secondary | ICD-10-CM | POA: Insufficient documentation

## 2015-01-27 DIAGNOSIS — Z79899 Other long term (current) drug therapy: Secondary | ICD-10-CM | POA: Insufficient documentation

## 2015-01-27 DIAGNOSIS — Z87891 Personal history of nicotine dependence: Secondary | ICD-10-CM | POA: Insufficient documentation

## 2015-01-27 NOTE — Progress Notes (Signed)
See progress note under physician encounter. 

## 2015-01-27 NOTE — Progress Notes (Signed)
Vitals stable. Reports persistent low back pain. Reports hx of back pain for fifty years that typically resolves with rest and Tylenol. Recently, back pain has been unrelieved and concerns the patient. Denies dysuria. Reports hematuria resolved two days s/p biopsy. Denies night sweats, diarrhea, or unintentional weight loss. Reports occasional urgency. Reports that since his biopsy the urgency to void will present but, "only a little bit comes out." Denies leakage.   BP 107/72 mmHg  Pulse 74  Temp(Src) 98 F (36.7 C) (Oral)  Resp 16  Ht 5\' 10"  (1.778 m)  Wt 170 lb 6.4 oz (77.293 kg)  BMI 24.45 kg/m2  SpO2 100% Wt Readings from Last 3 Encounters:  01/27/15 170 lb 6.4 oz (77.293 kg)  12/06/14 168 lb (76.204 kg)  10/18/14 168 lb (76.204 kg)

## 2015-01-27 NOTE — Progress Notes (Signed)
Radiation Oncology         [phone]) [phone] ________________________________  Initial outpatient Consultation  Name: Cory Willis  MRN: [id number]  Date: 01/27/2015  DOB: Oct 26, 1950  LF:[id number], Blanchard Mane, MD   REFERRING PHYSICIAN: Irine Seal, MD  DIAGNOSIS: 64 y.o. 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 Malignant neoplasm of prostate [place] ILLNESS::Cory Willis is a 64 y.o. gentleman.  He was noted to have an elevated PSA of 6.22 on 08/26/14 by Ila Mcgill, PA.  Accordingly, he was referred for evaluation in urology by Dr. Jeffie Pollock on 11/26/14,  digital rectal examination was performed at that time revealing 3+ prostate with no nodules.  The patient proceeded to transrectal ultrasound with 12 biopsies of the prostate on 12/31/14.  The prostate volume measured 57 cc.  Out of 12 core biopsies, 2 were positive.  The maximum Gleason score was 3+4, and this was seen in the right lateral mid with Gleason's 3+3 in the right lateral base.  The patient reviewed the biopsy results with his urologist and he has kindly been referred today for discussion of potential radiation treatment options.   PREVIOUS RADIATION THERAPY: No  PAST MEDICAL HISTORY:  has a past medical history of Cataract; Shortness of breath dyspnea; Arthritis; Hyperlipidemia (12/06/2014); Prostate cancer; and Substance abuse.    PAST SURGICAL HISTORY: Past Surgical History  Procedure Laterality Date  . Inguinal hernia repair Left 10/18/2014    Procedure: OPEN LEFT INGUINAL HERNIA REPAIR WITH MESH;  Surgeon: [name], MD;  Location: [facility];  Service: General;  Laterality: Left;  . Insertion of mesh Left 10/18/2014    Procedure: INSERTION OF MESH;  Surgeon: [name], MD;  Location: [facility];  Service: General;  Laterality: Left;  . Prostate biopsy      FAMILY HISTORY: family history includes  Heart disease in his mother. There is no history of Colon cancer, Rectal cancer, Stomach cancer, or Esophageal cancer.  SOCIAL HISTORY:  reports that he quit smoking about 7 years ago. His smoking use included Cigarettes. He has a 40 pack-year smoking history. His smokeless tobacco use includes Snuff. He reports that he does not drink alcohol or use illicit drugs.  ALLERGIES: Review of patient's allergies indicates no known allergies.  MEDICATIONS:  Current Outpatient Prescriptions  Medication Sig Dispense Refill  . acetaminophen (TYLENOL) 325 MG tablet Take 650 mg by mouth every 6 (six) hours as needed.    . chlorpheniramine (CHLOR-TRIMETON) 4 MG tablet Take 4 mg by mouth every 6 (six) hours as needed for allergies.    . simvastatin (ZOCOR) 20 MG tablet Take 1 tablet (20 mg total) by mouth at bedtime. 90 tablet 1  . aspirin 325 MG tablet Take 650 mg by mouth every 6 (six) hours as needed.    Marland Kitchen levofloxacin (LEVAQUIN) 500 MG tablet     . oxyCODONE (OXY IR/ROXICODONE) 5 MG immediate release tablet Take 1-2 tablets (5-10 mg total) by mouth every 4 (four) hours as needed for moderate pain, severe pain or breakthrough pain. (Patient not taking: Reported on 12/06/2014) 40 tablet 0   No current facility-administered medications for this encounter.    REVIEW OF SYSTEMS:  A 15 point review of systems is documented in the electronic medical record. This was obtained by the nursing staff. However, I reviewed this with the patient to  discuss relevant findings and make appropriate changes.  Pertinent items are noted in HPI..  The patient completed an IPSS and IIEF questionnaire.  His IPSS score was 15 indicating moderate urinary outflow obstructive symptoms.  He indicated that his erectile function is able to complete sexual activity on some attempts.  Vitals stable. Reports persistent low back pain. Reports hx of back pain for fifty years that typically resolves with rest and Tylenol. Recently, the back pain  has been unrelieved and concerns the patient. Denies dysuria. Reports hematuria resolved two days s/p biopsy. Denies night sweats, diarrhea, or unintentional weight loss. Reports occasional urgency. Reports that since his biopsy the urgency to void will present but, "only a little bit comes out." Denies leakage.     PHYSICAL EXAM: This patient is in no acute distress.  He is alert and oriented.   height is 5\' 10"  (1.778 m) and weight is 170 lb 6.4 oz (77.293 kg). His oral temperature is 98 F (36.7 C). His blood pressure is 107/72 and his pulse is 74. His respiration is 16 and oxygen saturation is 100%.  He exhibits no respiratory distress or labored breathing.  He appears neurologically intact.  His mood is pleasant.  His affect is appropriate.  Please note the digital rectal exam findings described above.  KPS = 100  100 - Normal; no complaints; no evidence of disease. 90   - Able to carry on normal activity; minor signs or symptoms of disease. 80   - Normal activity with effort; some signs or symptoms of disease. 23   - Cares for self; unable to carry on normal activity or to do active work. 60   - Requires occasional assistance, but is able to care for most of his personal needs. 50   - Requires considerable assistance and frequent medical care. 22   - Disabled; requires special care and assistance. 70   - Severely disabled; hospital admission is indicated although death not imminent. 84   - Very sick; hospital admission necessary; active supportive treatment necessary. 10   - Moribund; fatal processes progressing rapidly. 0     - Dead  Karnofsky DA, Abelmann [place]enton, Craver LS and Burchenal [facility] [phone]) The use of the nitrogen mustards in the palliative treatment of carcinoma: with particular reference to bronchogenic carcinoma Cancer 1 [phone]   LABORATORY DATA:  Lab Results  Component Value Date   WBC 6.2 10/11/2014   HGB 15.4 10/11/2014   HCT 44.9 10/11/2014   MCV 87.9 10/11/2014   PLT  300 10/11/2014   Lab Results  Component Value Date   NA 139 12/06/2014   K 4.1 12/06/2014   CL 104 12/06/2014   CO2 31 12/06/2014   Lab Results  Component Value Date   ALT 17 12/06/2014   AST 14 12/06/2014   ALKPHOS 57 12/06/2014   BILITOT 0.4 12/06/2014     RADIOGRAPHY: No results found.    IMPRESSION: This is a pleasant 64 y.o. 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  His T-Stage, Gleason's Score, and PSA put him into the intermediate risk group. He is clinically in a subset of patients eligible for seed implant as monotherapy given his unilateral Gleason's 3+4. He may not be ideally suited for seed implant given his large prostate and symptoms. He may be a candidate for androgen deprivation for downsizing.  Accordingly he is eligible for a variety of potential treatment options including  external radiation or possibly seed implant.  PLAN:Today I reviewed the findings and workup thus far.  We discussed the natural history of prostate cancer.  We reviewed the the implications of T-stage, Gleason's Score, and PSA on decision-making and outcomes in prostate cancer.  We discussed radiation treatment in the management of prostate cancer with regard to the logistics and delivery of external beam radiation treatment as well as the logistics and delivery of prostate brachytherapy.  We compared and contrasted each of these approaches and also compared these against prostatectomy.  The patient expressed interest in prostate brachytherapy.  I filled out a patient counseling form for him with relevant treatment diagrams and we retained a copy for our records.   The patient would like to proceed with prostate brachytherapy, and, understands the potential benefit to prostate downsizing.  I will share my findings with Dr. Jeffie Pollock and move forward with scheduling initiation of androgen deprivation and look forward to setting up the procedure in  2-4 months thereafter.  I'll defer to Dr. Jeffie Pollock regarding when the patient is ready to proceed with seeds.  I enjoyed meeting with him today, and will look forward to participating in the care of this very nice gentleman.   I spent 60 minutes face to face with the patient and more than 50% of that time was spent in counseling and/or coordination of care.   This document serves as a record of services personally performed by Tyler Pita, MD. It was created on his behalf by Darcus Austin, a trained medical scribe. The creation of this record is based on the scribe's personal observations and the provider's statements to them. This document has been checked and approved by the attending provider.     ------------------------------------------------  Sheral Apley Tammi Klippel, M.D.

## 2015-03-08 ENCOUNTER — Other Ambulatory Visit: Payer: Self-pay | Admitting: Urology

## 2015-03-09 ENCOUNTER — Telehealth: Payer: Self-pay | Admitting: *Deleted

## 2015-03-09 NOTE — Telephone Encounter (Signed)
CALLED PATIENT TO INFORM OF PRE-SEED PLANNING CT AND HIS IMPLANT, SPOKE WITH PATIENT AND HE IS AWARE OF THESE APPTS. 

## 2015-04-14 ENCOUNTER — Telehealth: Payer: Self-pay | Admitting: *Deleted

## 2015-04-14 NOTE — Telephone Encounter (Signed)
CALLED PATIENT TO REMIND OF APPTS. FOR 04-15-15, SPOKE WITH PATIENT AND HE IS AWARE OF THESE APPTS.

## 2015-04-15 ENCOUNTER — Ambulatory Visit
Admission: RE | Admit: 2015-04-15 | Discharge: 2015-04-15 | Disposition: A | Discharge status: Home / Self Care | Payer: Medicare HMO | Source: Ambulatory Visit | Attending: Radiation Oncology | Admitting: Radiation Oncology

## 2015-04-15 ENCOUNTER — Encounter (HOSPITAL_BASED_OUTPATIENT_CLINIC_OR_DEPARTMENT_OTHER)
Admission: RE | Admit: 2015-04-15 | Discharge: 2015-04-15 | Disposition: A | Discharge status: Home / Self Care | Payer: Medicare HMO | Source: Ambulatory Visit | Attending: Urology | Admitting: Urology

## 2015-04-15 ENCOUNTER — Ambulatory Visit (HOSPITAL_BASED_OUTPATIENT_CLINIC_OR_DEPARTMENT_OTHER)
Admission: RE | Admit: 2015-04-15 | Discharge: 2015-04-15 | Disposition: A | Discharge status: Home / Self Care | Payer: Medicare HMO | Source: Ambulatory Visit | Attending: Urology | Admitting: Urology

## 2015-04-15 DIAGNOSIS — C61 Malignant neoplasm of prostate: Secondary | ICD-10-CM

## 2015-04-15 DIAGNOSIS — Z510 Encounter for antineoplastic radiation therapy: Secondary | ICD-10-CM | POA: Diagnosis present

## 2015-04-15 NOTE — Progress Notes (Signed)
  Radiation Oncology         [phone]) [phone] ________________________________  Name: Cory Willis MRN: [id number]  Date: 04/15/2015  DOB: Jun 28, 1951  SIMULATION AND TREATMENT PLANNING NOTE PUBIC ARCH STUDY  KR:CVKFMMC, Blanchard Mane, MD  DIAGNOSIS: 64 y.o. 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 Malignant neoplasm of prostate 185 C61      No diagnosis found.  COMPLEX SIMULATION:  The patient presented today for evaluation for possible prostate seed implant. He was brought to the radiation planning suite and placed supine on the CT couch. A 3-dimensional image study set was obtained in upload to the planning computer. There, on each axial slice, I contoured the prostate gland. Then, using three-dimensional radiation planning tools I reconstructed the prostate in view of the structures from the transperineal needle pathway to assess for possible pubic arch interference. In doing so, I did not appreciate any pubic arch interference. Also, the patient's prostate volume was estimated based on the drawn structure. The volume was 41 cc.  Given the pubic arch appearance and prostate volume, patient remains a good candidate to proceed with prostate seed implant. Today, he freely provided informed written consent to proceed.    PLAN: The patient will undergo prostate seed implant.  This document serves as a record of services personally performed by Tyler Pita, MD. It was created on his behalf by Arlyce Harman, a trained medical scribe. The creation of this record is based on the scribe's personal observations and the provider's statements to them. This document has been checked and approved by the attending provider.    ________________________________  Sheral Apley. Tammi Klippel, M.D.

## 2015-05-25 ENCOUNTER — Telehealth: Payer: Self-pay | Admitting: *Deleted

## 2015-05-25 NOTE — Telephone Encounter (Signed)
CALLED PATIENT TO REMIND OF BLOOD WORK FOR 05-26-15, FOR PROCEDURE FOR 06-02-15, SPOKE WITH PATIENT AND HE IS AWARE OF THIS APPT.

## 2015-05-26 DIAGNOSIS — N401 Enlarged prostate with lower urinary tract symptoms: Secondary | ICD-10-CM | POA: Diagnosis not present

## 2015-05-26 DIAGNOSIS — Z79899 Other long term (current) drug therapy: Secondary | ICD-10-CM | POA: Diagnosis not present

## 2015-05-26 DIAGNOSIS — R3912 Poor urinary stream: Secondary | ICD-10-CM | POA: Diagnosis not present

## 2015-05-26 DIAGNOSIS — C61 Malignant neoplasm of prostate: Secondary | ICD-10-CM | POA: Diagnosis not present

## 2015-05-26 DIAGNOSIS — E7800 Pure hypercholesterolemia, unspecified: Secondary | ICD-10-CM | POA: Diagnosis not present

## 2015-05-26 DIAGNOSIS — Z87891 Personal history of nicotine dependence: Secondary | ICD-10-CM | POA: Diagnosis not present

## 2015-05-26 DIAGNOSIS — N138 Other obstructive and reflux uropathy: Secondary | ICD-10-CM | POA: Diagnosis not present

## 2015-05-26 LAB — CBC
HCT: 40.2 % (ref 39.0–52.0)
Hemoglobin: 13.5 g/dL (ref 13.0–17.0)
MCH: 30.5 pg (ref 26.0–34.0)
MCHC: 33.6 g/dL (ref 30.0–36.0)
MCV: 91 fL (ref 78.0–100.0)
Platelets: 304 10*3/uL (ref 150–400)
RBC: 4.42 MIL/uL (ref 4.22–5.81)
RDW: 13.2 % (ref 11.5–15.5)
WBC: 4.9 10*3/uL (ref 4.0–10.5)

## 2015-05-26 LAB — COMPREHENSIVE METABOLIC PANEL
ALK PHOS: 66 U/L (ref 38–126)
ALT: 24 U/L (ref 17–63)
ANION GAP: 6 (ref 5–15)
AST: 22 U/L (ref 15–41)
Albumin: 4.4 g/dL (ref 3.5–5.0)
BILIRUBIN TOTAL: 0.8 mg/dL (ref 0.3–1.2)
BUN: 13 mg/dL (ref 6–20)
CALCIUM: 11.1 mg/dL — AB (ref 8.9–10.3)
CO2: 28 mmol/L (ref 22–32)
Chloride: 106 mmol/L (ref 101–111)
Creatinine, Ser: 0.74 mg/dL (ref 0.61–1.24)
GLUCOSE: 107 mg/dL — AB (ref 65–99)
POTASSIUM: 5 mmol/L (ref 3.5–5.1)
Sodium: 140 mmol/L (ref 135–145)
TOTAL PROTEIN: 7.9 g/dL (ref 6.5–8.1)

## 2015-05-26 LAB — PROTIME-INR
INR: 1.01 (ref 0.00–1.49)
PROTHROMBIN TIME: 13.5 s (ref 11.6–15.2)

## 2015-05-26 LAB — APTT: aPTT: 29 seconds (ref 24–37)

## 2015-05-27 ENCOUNTER — Encounter (HOSPITAL_BASED_OUTPATIENT_CLINIC_OR_DEPARTMENT_OTHER): Payer: Self-pay | Admitting: *Deleted

## 2015-05-27 NOTE — Progress Notes (Addendum)
NPO AFTER MN, PT VERBALIZED UNDERSTANDING THIS INCLUDES NO DIP TOBACCO.  ARRIVE AT 0800.  CURRENT LAB RESULTS, EKG, AND CXR IN CHART AND EPIC.  WILL DO FLEET ENEMA AM DOS.

## 2015-05-30 DIAGNOSIS — C61 Malignant neoplasm of prostate: Secondary | ICD-10-CM | POA: Diagnosis not present

## 2015-06-01 ENCOUNTER — Telehealth: Payer: Self-pay | Admitting: *Deleted

## 2015-06-01 NOTE — H&P (Signed)
Reason For Visit Seen today for a preop visit.   Active Problems Problems  1. Preop general physical exam (Z01.818)   Assessed By: Jimmey Ralph (Urology); Last Assessed: 25 May 2015 2. Prostate cancer (C61)   Assessed By: Jimmey Ralph (Urology); Last Assessed: 25 May 2015  History of Present Illness 64 YO male patient of Dr. Ralene Muskrat seen today for a preop visit.    GU hx:  May 2016 prostate biopsy which was done for a PSA of 7.4.  He was found to have a T1c Nx Mx Gleason 7(3+4) prostate cancer with Gleason 7 in 70% of the right mid lateral core with PI and Gleason 6 in 20% of the right base lateral core.  His prostate volume is 70ml and his CAPRA score is 3.  He has intermediate risk disease. His IPSS is 21 and he reports normal erections but he hasn't been active for about 4 years.     June 2016 he was given Mills Koller to downsize the prostate and to attempt to improve his voiding symptoms. His IPSS has declined to 12 from 21. His PVR is 190ml but he couldn't do a flow rate. His PSA is down 50% to 3.6.     Oct 2016 Interval Hx:   Today states he is doing very well. Denies any issues with CP, SOB, or cough.   Past Medical History Problems  1. History of hypercholesterolemia (Z86.39)  Surgical History Problems  1. History of Hernia Repair  Current Meds 1. Acetaminophen CAPS;  Therapy: (Recorded:27Jul2016) to Recorded 2. Allergy CAPS;  Therapy: (Recorded:27Jul2016) to Recorded 3. Firmagon 120 MG Subcutaneous Solution Reconstituted; 120 mg  SQ rt and left  abdomen for total of 240 mg; To Be Done: [id number]; Status: HOLD FOR -  Administration Ordered  Allergies Medication  1. No Known Drug Allergies  Family History Problems  1. Family history of Death : Father  Social History Problems  1. Denied: History of Alcohol use   Recovering alcoholic.  none in 18 years. 2. Caffeine use (F15.90) 3. Divorced 4. Former smoker [phone] 5. Number of children   2  sons 2 daughters 71. Occupation   Cabin crew 7. Tobacco use (Z72.0)   1 pack for 40 years. quit 8 years ago.  Review of Systems Genitourinary, constitutional, skin, eye, otolaryngeal, hematologic/lymphatic, cardiovascular, pulmonary, endocrine, musculoskeletal, gastrointestinal, neurological and psychiatric system(s) were reviewed and pertinent findings if present are noted and are otherwise negative.    Vitals Vital Signs [Data Includes: Last 1 Day]  Recorded: 20Oct2016 08:57AM  Blood Pressure: 138 / 77 Temperature: 97.4 F Heart Rate: 56  Physical Exam Constitutional: Well nourished and well developed . No acute distress. The patient appears well hydrated.  ENT:. The ears and nose are normal in appearance.  Neck: The appearance of the neck is normal.  Pulmonary: No respiratory distress.  Cardiovascular: Heart rate and rhythm are normal.  Abdomen: The abdomen is flat. The abdomen is soft and nontender.  Skin: Normal skin turgor and normal skin color and pigmentation.  Neuro/Psych:. Mood and affect are appropriate.    Results/Data Urine [Data Includes: Last 1 Day]   20Oct2016  COLOR YELLOW   APPEARANCE CLEAR   SPECIFIC GRAVITY 1.020   pH 6.0   GLUCOSE NEGATIVE   BILIRUBIN NEGATIVE   KETONE NEGATIVE   BLOOD NEGATIVE   PROTEIN NEGATIVE   NITRITE NEGATIVE   LEUKOCYTE ESTERASE NEGATIVE    The following clinical lab reports were reviewed:  UA: negative.  Assessment Assessed  1. Preop general physical exam (Z01.818) 2. Prostate cancer (C61)  Plan Benign prostatic hyperplasia with urinary obstruction, Prostate cancer, Weak urinary stream  1. PVR U/S; Status:Hold For - Date of Service; Requested for:30Aug2016;  Health Maintenance  2. UA With REFLEX; [Do Not Release]; Status:Complete;   Done: 20Oct2016 08:47AM  Cleared to proceed with upcoming surgical procedure.

## 2015-06-01 NOTE — Telephone Encounter (Signed)
Called patient to remind of procedure for 06-02-15, spoke with patient and he is aware of this procedure

## 2015-06-02 ENCOUNTER — Ambulatory Visit (HOSPITAL_BASED_OUTPATIENT_CLINIC_OR_DEPARTMENT_OTHER)
Admission: RE | Admit: 2015-06-02 | Discharge: 2015-06-02 | Disposition: A | Discharge status: Home / Self Care | Payer: Medicare HMO | Source: Ambulatory Visit | Attending: Urology | Admitting: Urology

## 2015-06-02 ENCOUNTER — Encounter (HOSPITAL_BASED_OUTPATIENT_CLINIC_OR_DEPARTMENT_OTHER)
Admission: RE | Disposition: A | Discharge status: Home / Self Care | Payer: Self-pay | Source: Ambulatory Visit | Attending: Urology

## 2015-06-02 ENCOUNTER — Ambulatory Visit (HOSPITAL_BASED_OUTPATIENT_CLINIC_OR_DEPARTMENT_OTHER): Payer: Medicare HMO | Admitting: Anesthesiology

## 2015-06-02 ENCOUNTER — Ambulatory Visit (HOSPITAL_COMMUNITY): Payer: Medicare HMO

## 2015-06-02 ENCOUNTER — Encounter (HOSPITAL_BASED_OUTPATIENT_CLINIC_OR_DEPARTMENT_OTHER): Payer: Self-pay | Admitting: *Deleted

## 2015-06-02 DIAGNOSIS — E7800 Pure hypercholesterolemia, unspecified: Secondary | ICD-10-CM | POA: Diagnosis not present

## 2015-06-02 DIAGNOSIS — Z87891 Personal history of nicotine dependence: Secondary | ICD-10-CM | POA: Diagnosis not present

## 2015-06-02 DIAGNOSIS — R3912 Poor urinary stream: Secondary | ICD-10-CM | POA: Diagnosis not present

## 2015-06-02 DIAGNOSIS — N401 Enlarged prostate with lower urinary tract symptoms: Secondary | ICD-10-CM | POA: Insufficient documentation

## 2015-06-02 DIAGNOSIS — N138 Other obstructive and reflux uropathy: Secondary | ICD-10-CM | POA: Diagnosis not present

## 2015-06-02 DIAGNOSIS — C61 Malignant neoplasm of prostate: Secondary | ICD-10-CM | POA: Insufficient documentation

## 2015-06-02 DIAGNOSIS — Z79899 Other long term (current) drug therapy: Secondary | ICD-10-CM | POA: Insufficient documentation

## 2015-06-02 HISTORY — PX: CYSTOSCOPY: SHX5120

## 2015-06-02 HISTORY — DX: Presence of spectacles and contact lenses: Z97.3

## 2015-06-02 HISTORY — DX: Alcohol dependence, in remission: F10.21

## 2015-06-02 HISTORY — PX: RADIOACTIVE SEED IMPLANT: SHX5150

## 2015-06-02 HISTORY — DX: Frequency of micturition: R35.0

## 2015-06-02 SURGERY — INSERTION, RADIATION SOURCE, PROSTATE
Anesthesia: General | Site: Prostate

## 2015-06-02 MED ORDER — ACETAMINOPHEN 650 MG RE SUPP
650.0000 mg | RECTAL | Status: DC | PRN
  Filled 2015-06-02: qty 1

## 2015-06-02 MED ORDER — OXYCODONE HCL 5 MG PO TABS
5.0000 mg | ORAL_TABLET | ORAL | Status: DC | PRN
  Filled 2015-06-02: qty 2

## 2015-06-02 MED ORDER — KETOROLAC TROMETHAMINE 30 MG/ML IJ SOLN
INTRAMUSCULAR | Status: DC | PRN
  Administered 2015-06-02: 15 mg via INTRAVENOUS

## 2015-06-02 MED ORDER — PROPOFOL 10 MG/ML IV BOLUS
INTRAVENOUS | Status: DC | PRN
  Administered 2015-06-02: 200 mg via INTRAVENOUS

## 2015-06-02 MED ORDER — FENTANYL CITRATE (PF) 100 MCG/2ML IJ SOLN
25.0000 ug | INTRAMUSCULAR | Status: DC | PRN
  Filled 2015-06-02: qty 1

## 2015-06-02 MED ORDER — CIPROFLOXACIN HCL 500 MG PO TABS: 500.0000 mg | ORAL_TABLET | Freq: Two times a day (BID) | ORAL | Status: DC

## 2015-06-02 MED ORDER — TAMSULOSIN HCL 0.4 MG PO CAPS: 0.4000 mg | ORAL_CAPSULE | Freq: Every day | ORAL | Status: DC

## 2015-06-02 MED ORDER — HYDROCODONE-ACETAMINOPHEN 5-325 MG PO TABS: 1.0000 | ORAL_TABLET | Freq: Four times a day (QID) | ORAL | Status: DC | PRN

## 2015-06-02 MED ORDER — PROMETHAZINE HCL 25 MG/ML IJ SOLN
6.2500 mg | INTRAMUSCULAR | Status: DC | PRN
  Filled 2015-06-02: qty 1

## 2015-06-02 MED ORDER — DEXAMETHASONE SODIUM PHOSPHATE 4 MG/ML IJ SOLN
INTRAMUSCULAR | Status: DC | PRN
  Administered 2015-06-02: 10 mg via INTRAVENOUS

## 2015-06-02 MED ORDER — FENTANYL CITRATE (PF) 100 MCG/2ML IJ SOLN
INTRAMUSCULAR | Status: DC | PRN
  Administered 2015-06-02 (×2): 50 ug via INTRAVENOUS

## 2015-06-02 MED ORDER — SODIUM CHLORIDE 0.9 % IJ SOLN
3.0000 mL | Freq: Two times a day (BID) | INTRAMUSCULAR | Status: DC
  Filled 2015-06-02: qty 3

## 2015-06-02 MED ORDER — FENTANYL CITRATE (PF) 100 MCG/2ML IJ SOLN
INTRAMUSCULAR | Status: AC
  Filled 2015-06-02: qty 4

## 2015-06-02 MED ORDER — LACTATED RINGERS IV SOLN
INTRAVENOUS | Status: DC
  Administered 2015-06-02 (×2): via INTRAVENOUS
  Filled 2015-06-02: qty 1000

## 2015-06-02 MED ORDER — PHENYLEPHRINE HCL 10 MG/ML IJ SOLN
10.0000 mg | INTRAVENOUS | Status: DC | PRN
  Administered 2015-06-02: 20 ug/min via INTRAVENOUS

## 2015-06-02 MED ORDER — SODIUM CHLORIDE 0.9 % IV SOLN
250.0000 mL | INTRAVENOUS | Status: DC | PRN
  Filled 2015-06-02: qty 250

## 2015-06-02 MED ORDER — FLEET ENEMA 7-19 GM/118ML RE ENEM
1.0000 | ENEMA | Freq: Once | RECTAL | Status: AC
Start: 2015-06-03 — End: 2015-06-02
  Administered 2015-06-02: 1 via RECTAL
  Filled 2015-06-02: qty 1

## 2015-06-02 MED ORDER — MEPERIDINE HCL 25 MG/ML IJ SOLN
6.2500 mg | INTRAMUSCULAR | Status: DC | PRN
  Filled 2015-06-02: qty 1

## 2015-06-02 MED ORDER — IOHEXOL 350 MG/ML SOLN
INTRAVENOUS | Status: DC | PRN
  Administered 2015-06-02: 7 mL

## 2015-06-02 MED ORDER — CIPROFLOXACIN IN D5W 400 MG/200ML IV SOLN
INTRAVENOUS | Status: AC
  Filled 2015-06-02: qty 200

## 2015-06-02 MED ORDER — STERILE WATER FOR IRRIGATION IR SOLN
Status: DC | PRN
  Administered 2015-06-02: 3000 mL

## 2015-06-02 MED ORDER — EPHEDRINE SULFATE 50 MG/ML IJ SOLN
INTRAMUSCULAR | Status: DC | PRN
  Administered 2015-06-02 (×2): 10 mg via INTRAVENOUS

## 2015-06-02 MED ORDER — MIDAZOLAM HCL 2 MG/2ML IJ SOLN
INTRAMUSCULAR | Status: AC
  Filled 2015-06-02: qty 2

## 2015-06-02 MED ORDER — LIDOCAINE HCL (CARDIAC) 20 MG/ML IV SOLN
INTRAVENOUS | Status: DC | PRN
  Administered 2015-06-02: 80 mg via INTRAVENOUS

## 2015-06-02 MED ORDER — SODIUM CHLORIDE 0.9 % IJ SOLN
3.0000 mL | INTRAMUSCULAR | Status: DC | PRN
  Filled 2015-06-02: qty 3

## 2015-06-02 MED ORDER — CIPROFLOXACIN IN D5W 400 MG/200ML IV SOLN
400.0000 mg | INTRAVENOUS | Status: AC
  Administered 2015-06-02: 400 mg via INTRAVENOUS
  Filled 2015-06-02: qty 200

## 2015-06-02 MED ORDER — ACETAMINOPHEN 325 MG PO TABS
650.0000 mg | ORAL_TABLET | ORAL | Status: DC | PRN
  Filled 2015-06-02: qty 2

## 2015-06-02 MED ORDER — MIDAZOLAM HCL 5 MG/5ML IJ SOLN
INTRAMUSCULAR | Status: DC | PRN
  Administered 2015-06-02: 2 mg via INTRAVENOUS

## 2015-06-02 SURGICAL SUPPLY — 33 items
BAG URINE DRAINAGE (UROLOGICAL SUPPLIES) ×3 IMPLANT
BLADE CLIPPER SURG (BLADE) ×3 IMPLANT
CATH FOLEY 2WAY SLVR  5CC 16FR (CATHETERS) ×1
CATH FOLEY 2WAY SLVR 5CC 16FR (CATHETERS) ×2 IMPLANT
CATH ROBINSON RED A/P 20FR (CATHETERS) ×3 IMPLANT
CLOTH BEACON ORANGE TIMEOUT ST (SAFETY) ×3 IMPLANT
COVER BACK TABLE 60X90IN (DRAPES) ×3 IMPLANT
COVER MAYO STAND STRL (DRAPES) ×3 IMPLANT
DRSG TEGADERM 4X4.75 (GAUZE/BANDAGES/DRESSINGS) ×3 IMPLANT
DRSG TEGADERM 8X12 (GAUZE/BANDAGES/DRESSINGS) ×3 IMPLANT
GLOVE BIO SURGEON STRL SZ 6.5 (GLOVE) ×3 IMPLANT
GLOVE BIO SURGEON STRL SZ7.5 (GLOVE) ×12 IMPLANT
GLOVE BIOGEL PI IND STRL 6.5 (GLOVE) ×4 IMPLANT
GLOVE BIOGEL PI IND STRL 7.5 (GLOVE) ×2 IMPLANT
GLOVE BIOGEL PI INDICATOR 6.5 (GLOVE) ×2
GLOVE BIOGEL PI INDICATOR 7.5 (GLOVE) ×1
GLOVE ECLIPSE 8.0 STRL XLNG CF (GLOVE) ×15 IMPLANT
GLOVE SURG SS PI 8.0 STRL IVOR (GLOVE) ×6 IMPLANT
GOWN STRL REUS W/ TWL LRG LVL3 (GOWN DISPOSABLE) ×2 IMPLANT
GOWN STRL REUS W/ TWL XL LVL3 (GOWN DISPOSABLE) ×2 IMPLANT
GOWN STRL REUS W/TWL LRG LVL3 (GOWN DISPOSABLE) ×1
GOWN STRL REUS W/TWL XL LVL3 (GOWN DISPOSABLE) ×1
HOLDER FOLEY CATH W/STRAP (MISCELLANEOUS) ×3 IMPLANT
KIT ROOM TURNOVER WOR (KITS) ×3 IMPLANT
MANIFOLD NEPTUNE II (INSTRUMENTS) IMPLANT
NUCLETRON SELECTSEED ×210 IMPLANT
PACK CYSTO (CUSTOM PROCEDURE TRAY) ×3 IMPLANT
SPONGE GAUZE 4X4 12PLY STER LF (GAUZE/BANDAGES/DRESSINGS) ×3 IMPLANT
SYRINGE 10CC LL (SYRINGE) ×3 IMPLANT
TUBE CONNECTING 12X1/4 (SUCTIONS) IMPLANT
UNDERPAD 30X30 INCONTINENT (UNDERPADS AND DIAPERS) ×6 IMPLANT
WATER STERILE IRR 3000ML UROMA (IV SOLUTION) ×3 IMPLANT
WATER STERILE IRR 500ML POUR (IV SOLUTION) ×3 IMPLANT

## 2015-06-02 NOTE — Anesthesia Postprocedure Evaluation (Signed)
  Anesthesia Post-op Note  Patient: Cory Willis  Procedure(s) Performed: Procedure(s) (LRB): RADIOACTIVE SEED IMPLANT/BRACHYTHERAPY IMPLANT (N/A) CYSTOSCOPY FLEXIBLE (N/A)  Patient Location: PACU  Anesthesia Type: General  Level of Consciousness: awake and alert   Airway and Oxygen Therapy: Patient Spontanous Breathing  Post-op Pain: mild  Post-op Assessment: Post-op Vital signs reviewed, Patient's Cardiovascular Status Stable, Respiratory Function Stable, Patent Airway and No signs of Nausea or vomiting  Last Vitals:  Filed Vitals:   06/02/15 1145  BP: 120/73  Pulse: 66  Temp:   Resp: 18    Post-op Vital Signs: stable   Complications: No apparent anesthesia complications

## 2015-06-02 NOTE — Anesthesia Preprocedure Evaluation (Signed)
Anesthesia Evaluation  Patient identified by MRN, date of birth, ID band Patient awake    Reviewed: Allergy & Precautions, NPO status , Patient's Chart, lab work & pertinent test results  Airway Mallampati: II  TM Distance: >3 FB Neck ROM: Full    Dental no notable dental hx.    Pulmonary neg pulmonary ROS, former smoker,    Pulmonary exam normal breath sounds clear to auscultation       Cardiovascular negative cardio ROS Normal cardiovascular exam Rhythm:Regular Rate:Normal     Neuro/Psych negative neurological ROS  negative psych ROS   GI/Hepatic negative GI ROS, Neg liver ROS,   Endo/Other  negative endocrine ROS  Renal/GU negative Renal ROS  negative genitourinary   Musculoskeletal negative musculoskeletal ROS (+)   Abdominal   Peds negative pediatric ROS (+)  Hematology negative hematology ROS (+)   Anesthesia Other Findings   Reproductive/Obstetrics negative OB ROS                             Anesthesia Physical Anesthesia Plan  ASA: II  Anesthesia Plan: General   Post-op Pain Management:    Induction: Intravenous  Airway Management Planned: Oral ETT and LMA  Additional Equipment:   Intra-op Plan:   Post-operative Plan: Extubation in OR  Informed Consent: I have reviewed the patients History and Physical, chart, labs and discussed the procedure including the risks, benefits and alternatives for the proposed anesthesia with the patient or authorized representative who has indicated his/her understanding and acceptance.   Dental advisory given  Plan Discussed with: CRNA  Anesthesia Plan Comments:         Anesthesia Quick Evaluation

## 2015-06-02 NOTE — Op Note (Signed)
PATIENT:  [name]  PRE-OPERATIVE DIAGNOSIS:  Adenocarcinoma of the prostate  POST-OPERATIVE DIAGNOSIS:  Same  PROCEDURE:  Procedure(s): 1. I-125 radioactive seed implantation 2. Cystoscopy  SURGEON:  Surgeon(s): Irine Seal MD  Radiation oncologist: Dr. Tyler Pita  ANESTHESIA:  General  EBL:  Minimal  DRAINS: 71 French Foley catheter  INDICATION: Cory Willis is a 64 y.o. with Stage T1c, Gleason 7(3+4) low volume prostate cancer who has elected brachytherapy for treatment.  Description of procedure: After informed consent the patient was brought to the major OR, placed on the table and administered general anesthesia. He was then moved to the modified lithotomy position with his perineum perpendicular to the floor. His perineum and genitalia were then sterilely prepped. An official timeout was then performed. A 16 French Foley catheter was then placed in the bladder and filled with dilute contrast, a rectal tube was placed in the rectum and the transrectal ultrasound probe was placed in the rectum and affixed to the stand. He was then sterilely draped.  The sterile grid was installed.   Anchor needles were then placed.   Real time ultrasonography was used along with the seed planning software spot-pro version 3.1-00. This was used to develop the seed plan including the number of needles as well as number of seeds required for complete and adequate coverage. Real-time ultrasonography was then used along with the previously developed plan and the Nucletron device to implant a total of 70 seeds using 22 needles for a target dose of 145 Gy. This proceeded without difficulty or complication.  A Foley catheter was then removed as well as the transrectal ultrasound probe and rectal probe. Flexible cystoscopy was then performed using the 17 French flexible scope which revealed a normal urethra throughout its length down to the sphincter which appeared intact. The prostatic urethra was 3cm  with bilobar hyperplasia with some obstruction.  There was a single seed in the mid prostatic urethra that was removed with graspers.  The bladder was then entered and fully and systematically inspected.  The ureteral orifices were noted to be of normal configuration and position. The mucosa revealed no evidence of tumors. There were also no stones identified within the bladder.  No seeds or spacers were seen and/or removed from the bladder.  The cystoscope was then removed.  The drapes were removed.  The perineum was cleaned and dressed.  He was taken out of the lithotomy position and was awakened and taken to recovery room in stable and satisfactory condition. He tolerated procedure well and there were no intraoperative complications.

## 2015-06-02 NOTE — Anesthesia Procedure Notes (Signed)
Procedure Name: LMA Insertion Date/Time: 06/02/2015 9:42 AM Performed by: Bethena Roys T Pre-anesthesia Checklist: Patient identified, Emergency Drugs available, Suction available and Patient being monitored Patient Re-evaluated:Patient Re-evaluated prior to inductionOxygen Delivery Method: Circle System Utilized Preoxygenation: Pre-oxygenation with 100% oxygen Intubation Type: IV induction Ventilation: Mask ventilation without difficulty LMA: LMA inserted LMA Size: 5.0 Number of attempts: 1 Airway Equipment and Method: Bite block Placement Confirmation: positive ETCO2 Dental Injury: Teeth and Oropharynx as per pre-operative assessment

## 2015-06-02 NOTE — Progress Notes (Signed)
  Radiation Oncology         [phone]) [phone] ________________________________  Name: Cory Willis MRN: [id number]  Date: 06/02/2015  DOB: Aug 08, 1950       Prostate Seed Implant  CC:Pcp Not In System  No ref. provider found  DIAGNOSIS:   64 y.o. 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 Prostate cancer 185 C61 DG Chest 2 View     DG Chest 2 View    PROCEDURE: Insertion of radioactive I-125 seeds into the prostate gland.  RADIATION DOSE: 145 Gy, definitive therapy.  TECHNIQUE: Cory Willis was brought to the operating room with the urologist. He was placed in the dorsolithotomy position. He was catheterized and a rectal tube was inserted. The perineum was shaved, prepped and draped. The ultrasound probe was then introduced into the rectum to see the prostate gland.  TREATMENT DEVICE: A needle grid was attached to the ultrasound probe stand and anchor needles were placed.  3D PLANNING: The prostate was imaged in 3D using a sagittal sweep of the prostate probe. These images were transferred to the planning computer. There, the prostate, urethra and rectum were defined on each axial reconstructed image. Then, the software created an optimized 3D plan and a few seed positions were adjusted. The quality of the plan was reviewed using [facility] information for the target and the following two organs at risk:  Urethra and Rectum.  Then the accepted plan was uploaded to the seed Selectron afterloading unit.  PROSTATE VOLUME STUDY:  Using transrectal ultrasound the volume of the prostate was verified to be 43 cc.  SPECIAL TREATMENT PROCEDURE/SUPERVISION AND HANDLING: The Nucletron FIRST system was used to place the needles under sagittal guidance. A total of 22 needles were used to deposit 70 seeds in the prostate gland. The individual seed activity was 0.464 mCi.  COMPLEX SIMULATION: At the end of the procedure, an  anterior radiograph of the pelvis was obtained to document seed positioning and count. Cystoscopy was performed to check the urethra and bladder.  One seed was found and removed from the prostatic urethra.  MICRODOSIMETRY: At the end of the procedure, the patient was emitting 0.1 mR/hr at 1 meter. Accordingly, he was considered safe for hospital discharge.  PLAN: The patient will return to the radiation oncology clinic for post implant CT dosimetry in three weeks.   ________________________________  Sheral Apley Tammi Klippel, M.D.

## 2015-06-02 NOTE — Discharge Instructions (Addendum)
Radioactive Seed Implant Home Care Instructions   Activity:    Rest for the remainder of the day.  Do not drive or operate equipment today.  You may resume normal activities in a few days as instructed by your physician, without risk of harmful radiation exposure to those around you, provided you follow the time and distance precautions on the Radiation Oncology Instruction Sheet.   Meals: Drink plenty of lipuids and eat light foods, such as gelatin or soup this evening .  You may return to normal meal plan tomorrow.  ReturnTo Work: You may return to work as instructed by Naval architect.  Special Instruction:   If any seeds are found, use tweezers to pick up seeds and place in a glass container of any kind and bring to your physician's office.  Call your physician if any of these symptoms occur:   Persistent or heavy bleeding  Urine stream diminishes or stops completely after catheter is removed  Fever equal to or greater than 101 degrees F  Cloudy urine with a strong foul odor  Severe pain  You may feel some burning pain and/or hesitancy when you urinate after the catheter is removed.  These symptoms may increase over the next few weeks, but should diminish within forur to six weeks.  Applying moist heat to the lower abdomen or a hot tub bath may help relieve the pain.  If the discomfort becomes severe, please call your physician for additional medications.  Follow-up (Date of Return Visit to Physician): 11/17 at 11am  I have given you a prescription for tamsulosin to aid with urination.   This is to be taken daily 89min after a meal.  Side effects can include dizziness when standing, nasal congestion and a dry ejaculation.   If you have a history of cataracts or are likely to need cataract surgery, please don't take this medication and let me know.      Post Anesthesia Home Care Instructions  Activity: Get plenty of rest for the remainder of the day. A responsible adult should  stay with you for 24 hours following the procedure.  For the next 24 hours, DO NOT: -Drive a car -Paediatric nurse -Drink alcoholic beverages -Take any medication unless instructed by your physician -Make any legal decisions or sign important papers.  Meals: Start with liquid foods such as gelatin or soup. Progress to regular foods as tolerated. Avoid greasy, spicy, heavy foods. If nausea and/or vomiting occur, drink only clear liquids until the nausea and/or vomiting subsides. Call your physician if vomiting continues.  Special Instructions/Symptoms: Your throat may feel dry or sore from the anesthesia or the breathing tube placed in your throat during surgery. If this causes discomfort, gargle with warm salt water. The discomfort should disappear within 24 hours.  If you had a scopolamine patch placed behind your ear for the management of post- operative nausea and/or vomiting:  1. The medication in the patch is effective for 72 hours, after which it should be removed.  Wrap patch in a tissue and discard in the trash. Wash hands thoroughly with soap and water. 2. You may remove the patch earlier than 72 hours if you experience unpleasant side effects which may include dry mouth, dizziness or visual disturbances. 3. Avoid touching the patch. Wash your hands with soap and water after contact with the patch.

## 2015-06-02 NOTE — Transfer of Care (Signed)
Immediate Anesthesia Transfer of Care Note  Patient: Cory Willis  Procedure(s) Performed: Procedure(s) with comments: RADIOACTIVE SEED IMPLANT/BRACHYTHERAPY IMPLANT (N/A) -  69 SEEDS IMPLANTED CYSTOSCOPY FLEXIBLE (N/A) - 1 SEEDS FOUND IN BLADDER and removed  Patient Location: PACU  Anesthesia Type:General  Level of Consciousness: awake and oriented  Airway & Oxygen Therapy: Patient Spontanous Breathing and Patient connected to nasal cannula oxygen  Post-op Assessment: Report given to RN  Post vital signs: Reviewed and stable  Last Vitals:  Filed Vitals:   06/02/15 0806  BP: 144/71  Pulse: 66  Temp: 36.4 C  Resp: 16    Complications: No apparent anesthesia complications

## 2015-06-02 NOTE — Interval H&P Note (Signed)
History and Physical Interval Note:  06/02/2015 9:15 AM  Cory Willis  has presented today for surgery, with the diagnosis of prostate cancer  The various methods of treatment have been discussed with the patient and family. After consideration of risks, benefits and other options for treatment, the patient has consented to  Procedure(s): RADIOACTIVE SEED IMPLANT/BRACHYTHERAPY IMPLANT (N/A) as a surgical intervention .  The patient's history has been reviewed, patient examined, no change in status, stable for surgery.  I have reviewed the patient's chart and labs.  Questions were answered to the patient's satisfaction.     Cory Willis J

## 2015-06-03 ENCOUNTER — Encounter (HOSPITAL_BASED_OUTPATIENT_CLINIC_OR_DEPARTMENT_OTHER): Payer: Self-pay | Admitting: Urology

## 2015-06-22 ENCOUNTER — Telehealth: Payer: Self-pay | Admitting: *Deleted

## 2015-06-22 NOTE — Telephone Encounter (Signed)
Called patient to remind of appts. For 06-23-15, spoke with patient and he is aware of these appts.

## 2015-06-23 ENCOUNTER — Encounter: Payer: Self-pay | Admitting: Radiation Oncology

## 2015-06-23 ENCOUNTER — Telehealth: Payer: Self-pay | Admitting: *Deleted

## 2015-06-23 ENCOUNTER — Ambulatory Visit: Payer: Medicare HMO | Attending: Radiation Oncology

## 2015-06-23 ENCOUNTER — Ambulatory Visit
Admit: 2015-06-23 | Discharge: 2015-06-23 | Disposition: A | Discharge status: Home / Self Care | Payer: Medicare HMO | Source: Ambulatory Visit | Attending: Radiation Oncology | Admitting: Radiation Oncology

## 2015-06-23 ENCOUNTER — Ambulatory Visit
Admission: RE | Admit: 2015-06-23 | Discharge: 2015-06-23 | Disposition: A | Discharge status: Home / Self Care | Payer: Medicare HMO | Source: Ambulatory Visit | Attending: Radiation Oncology | Admitting: Radiation Oncology

## 2015-06-23 VITALS — BP 131/69 | HR 61 | Temp 97.2°F | Resp 20 | Ht 70.0 in | Wt 184.5 lb

## 2015-06-23 DIAGNOSIS — R300 Dysuria: Secondary | ICD-10-CM | POA: Insufficient documentation

## 2015-06-23 DIAGNOSIS — R350 Frequency of micturition: Secondary | ICD-10-CM | POA: Insufficient documentation

## 2015-06-23 DIAGNOSIS — Z79899 Other long term (current) drug therapy: Secondary | ICD-10-CM | POA: Insufficient documentation

## 2015-06-23 DIAGNOSIS — C61 Malignant neoplasm of prostate: Secondary | ICD-10-CM

## 2015-06-23 DIAGNOSIS — Z510 Encounter for antineoplastic radiation therapy: Secondary | ICD-10-CM | POA: Insufficient documentation

## 2015-06-23 DIAGNOSIS — R3911 Hesitancy of micturition: Secondary | ICD-10-CM | POA: Insufficient documentation

## 2015-06-23 DIAGNOSIS — R351 Nocturia: Secondary | ICD-10-CM | POA: Diagnosis not present

## 2015-06-23 NOTE — Progress Notes (Signed)
  Radiation Oncology         ([phone] ________________________________  Name: Windom Balser  MRN: [id number]  Date: 06/23/2015  DOB: [birth year]  COMPLEX SIMULATION NOTE  NARRATIVE:  The patient was brought to the [facility] today following prostate seed implantation approximately one month ago.  Identity was confirmed.  All relevant records and images related to the planned course of therapy were reviewed.  Then, the patient was set-up supine.  CT images were obtained.  The CT images were loaded into the planning software.  Then the prostate and rectum were contoured.  Treatment planning then occurred.  The implanted iodine 125 seeds were identified by the physics staff for projection of radiation distribution  I have requested : 3D Simulation  I have requested a DVH of the following structures: Prostate and rectum.    ________________________________  Sheral Apley Tammi Klippel, M.D.  This document serves as a record of services personally performed by Tyler Pita, MD. It was created on his behalf by Arlyce Harman, a trained medical scribe. The creation of this record is based on the scribe's personal observations and the provider's statements to them. This document has been checked and approved by the attending provider.

## 2015-06-23 NOTE — Progress Notes (Addendum)
Follow up post seed implantation 06/02/15, patient   Has had dysuria when first having to void, only 6-8 drops of urine  come out for the first 3x every 15 minutes then he will have a good flow after that,but gets up every hour at night, during the day is better, sometimes in morning gets good flow and sometimes he doesn't,  he forgot or didn't know to pick up rx flomax  At [facility] on 06/02/15, having regular bowel movements, appetite , BP 131/69 mmHg  Pulse 61  Temp(Src) 97.2 F (36.2 C) (Oral)  Resp 20  Ht 5\' 10"  (1.778 m)  Wt 184 lb 8 oz (83.689 kg)  BMI 26.47 kg/m2 , I-PSS score 29 Wt Readings from Last 3 Encounters:  06/23/15 184 lb 8 oz (83.689 kg)  06/02/15 180 lb 8 oz (81.874 kg)  01/27/15 170 lb 6.4 oz (77.293 kg)

## 2015-06-23 NOTE — Telephone Encounter (Signed)
error 

## 2015-06-23 NOTE — Progress Notes (Signed)
Radiation Oncology         ([phone]) [phone] ________________________________  Name: Cory Willis MRN: [id number]  Date: 06/23/2015  DOB: 1950-10-22  Follow-Up Visit Note  CC: Pcp Not In System  Irine Seal, MD  Diagnosis:   64 y.o. 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 Malignant neoplasm of prostate (HCC) 185 C61     Interval Since Last Radiation:  3  weeks  Narrative:  The patient returns today for routine follow-up for post seed implantation on 06/02/15.  He is complaining of increased urinary frequency and urinary hesitation symptoms. He filled out a questionnaire regarding urinary function today providing and overall IPSS score of 29 characterizing his symptoms as severe.  His pre-implant score was 15. He denies any bowel symptoms.  He has had dysuria when first having to void, with only 6-8 drops of urine coming out for the first 3x every 15 minutes then he will have a good flow after that. He gets up every hour at night, stating during the day is better. Sometimes in the morning he has a good flow and sometimes he doesn't. He forgot or didn't know to pick up prescription for flomax at [facility] on 06/02/15. He is having regular bowel movements.   ALLERGIES:  has No Known Allergies.  Meds: Current Outpatient Prescriptions  Medication Sig Dispense Refill  . acetaminophen (TYLENOL) 325 MG tablet Take 650 mg by mouth every 6 (six) hours as needed.    . chlorpheniramine (CHLOR-TRIMETON) 4 MG tablet Take 4 mg by mouth every 6 (six) hours as needed for allergies.    Marland Kitchen HYDROcodone-acetaminophen (NORCO) 5-325 MG tablet Take 1 tablet by mouth every 6 (six) hours as needed for moderate pain. (Patient not taking: Reported on 06/23/2015) 15 tablet 0  . simvastatin (ZOCOR) 20 MG tablet Take 1 tablet (20 mg total) by mouth at bedtime. (Patient not taking: Reported on 06/23/2015) 90 tablet 1  . tamsulosin  (FLOMAX) 0.4 MG CAPS capsule Take 1 capsule (0.4 mg total) by mouth daily. (Patient not taking: Reported on 06/23/2015) 30 capsule 1   No current facility-administered medications for this encounter.    Physical Findings: The patient is in no acute distress. Patient is alert and oriented.  height is 5\' 10"  (1.778 m) and weight is 184 lb 8 oz (83.689 kg). His oral temperature is 97.2 F (36.2 C). His blood pressure is 131/69 and his pulse is 61. His respiration is 20. Marland Kitchen  No significant changes.  Lab Findings: Lab Results  Component Value Date   WBC 4.9 05/26/2015   HGB 13.5 05/26/2015   HCT 40.2 05/26/2015   MCV 91.0 05/26/2015   PLT 304 05/26/2015    Radiographic Findings:  Patient underwent CT imaging in our clinic for post implant dosimetry. The CT appears to demonstrate an adequate distribution of radioactive seeds throughout the prostate gland. There no seeds in her near the rectum. I suspect the final radiation plan and dosimetry will show appropriate coverage of the prostate gland.   Impression: The patient is recovering from the effects of radiation. His urinary symptoms should gradually improve over the next 4-6 months. We talked about this today. He is encouraged by his improvement already and is otherwise please with his outcome.   Plan: Today, I spent time talking to the patient about his prostate seed implant and resolving urinary symptoms. We also talked about  long-term follow-up for prostate cancer following seed implant. He understands that ongoing PSA determinations and digital rectal exams will help perform surveillance to rule out disease recurrence. He understands what to expect with his PSA measures. Patient was also educated today about some of the long-term effects from radiation including a small risk for rectal bleeding and possibly erectile dysfunction. We talked about some of the general management approaches to these potential complications. However, I did  encourage the patient to contact our office or return at any point if he has questions or concerns related to his previous radiation and prostate cancer.  _____________________________________  Sheral Apley. Tammi Klippel, M.D.    This document serves as a record of services personally performed by Tyler Pita, MD. It was created on his behalf by Arlyce Harman, a trained medical scribe. The creation of this record is based on the scribe's personal observations and the provider's statements to them. This document has been checked and approved by the attending provider.

## 2015-07-07 DIAGNOSIS — C61 Malignant neoplasm of prostate: Secondary | ICD-10-CM | POA: Diagnosis not present

## 2015-07-13 NOTE — Progress Notes (Signed)
  Radiation Oncology         [phone] ________________________________  Name: Cory Willis MRN: [id number]  Date: 06/23/2015  DOB: Apr 20, 1951  3D Planning Note   Prostate Brachytherapy Post-Implant Dosimetry  Diagnosis: 64 y.o. 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 He underwent CT scan complex simulation to delineate the three-dimensional structures of the pelvis and demonstrate the radiation distribution.  Since that time, the seed localization, and complex isodose planning with dose volume histograms have now been completed.  Results:   Prostate Coverage - The dose of radiation delivered to the 90% or more of the prostate gland (D90) was 88.2% of the prescription dose. This is within range of our goal of greater than 90%. Rectal Sparing - The volume of rectal tissue receiving the prescription dose or higher was 0.0 cc. This falls under our thresholds tolerance of 1.0 cc.  Impression: The prostate seed implant appears to show adequate target coverage and appropriate rectal sparing.  Plan:  The patient will continue to follow with urology for ongoing PSA determinations. I would anticipate a high likelihood for local tumor control with minimal risk for rectal morbidity.  ________________________________  Sheral Apley Tammi Klippel, M.D.

## 2015-09-14 ENCOUNTER — Emergency Department (HOSPITAL_COMMUNITY)
Admission: EM | Admit: 2015-09-14 | Discharge: 2015-09-15 | Disposition: A | Discharge status: Home / Self Care | Payer: Medicare HMO | Attending: Emergency Medicine | Admitting: Emergency Medicine

## 2015-09-14 ENCOUNTER — Emergency Department (HOSPITAL_COMMUNITY): Payer: Medicare HMO

## 2015-09-14 ENCOUNTER — Encounter (HOSPITAL_COMMUNITY): Payer: Self-pay

## 2015-09-14 DIAGNOSIS — L0291 Cutaneous abscess, unspecified: Secondary | ICD-10-CM

## 2015-09-14 DIAGNOSIS — M19012 Primary osteoarthritis, left shoulder: Secondary | ICD-10-CM | POA: Diagnosis not present

## 2015-09-14 DIAGNOSIS — Z8546 Personal history of malignant neoplasm of prostate: Secondary | ICD-10-CM | POA: Diagnosis not present

## 2015-09-14 DIAGNOSIS — Z8639 Personal history of other endocrine, nutritional and metabolic disease: Secondary | ICD-10-CM | POA: Insufficient documentation

## 2015-09-14 DIAGNOSIS — L03313 Cellulitis of chest wall: Secondary | ICD-10-CM | POA: Diagnosis not present

## 2015-09-14 DIAGNOSIS — Z973 Presence of spectacles and contact lenses: Secondary | ICD-10-CM | POA: Diagnosis not present

## 2015-09-14 DIAGNOSIS — L02213 Cutaneous abscess of chest wall: Secondary | ICD-10-CM | POA: Diagnosis not present

## 2015-09-14 DIAGNOSIS — M7989 Other specified soft tissue disorders: Secondary | ICD-10-CM | POA: Diagnosis not present

## 2015-09-14 DIAGNOSIS — L02416 Cutaneous abscess of left lower limb: Secondary | ICD-10-CM | POA: Diagnosis not present

## 2015-09-14 DIAGNOSIS — L03116 Cellulitis of left lower limb: Secondary | ICD-10-CM | POA: Insufficient documentation

## 2015-09-14 DIAGNOSIS — L0390 Cellulitis, unspecified: Secondary | ICD-10-CM

## 2015-09-14 DIAGNOSIS — Z87891 Personal history of nicotine dependence: Secondary | ICD-10-CM | POA: Insufficient documentation

## 2015-09-14 DIAGNOSIS — M19011 Primary osteoarthritis, right shoulder: Secondary | ICD-10-CM | POA: Diagnosis not present

## 2015-09-14 DIAGNOSIS — Z79899 Other long term (current) drug therapy: Secondary | ICD-10-CM | POA: Diagnosis not present

## 2015-09-14 DIAGNOSIS — B9689 Other specified bacterial agents as the cause of diseases classified elsewhere: Secondary | ICD-10-CM | POA: Diagnosis not present

## 2015-09-14 LAB — CBC WITH DIFFERENTIAL/PLATELET
Basophils Absolute: 0 10*3/uL (ref 0.0–0.1)
Basophils Relative: 0 %
EOS PCT: 1 %
Eosinophils Absolute: 0.1 10*3/uL (ref 0.0–0.7)
HCT: 36.2 % — ABNORMAL LOW (ref 39.0–52.0)
Hemoglobin: 12 g/dL — ABNORMAL LOW (ref 13.0–17.0)
LYMPHS ABS: 1.7 10*3/uL (ref 0.7–4.0)
LYMPHS PCT: 17 %
MCH: 29.5 pg (ref 26.0–34.0)
MCHC: 33.1 g/dL (ref 30.0–36.0)
MCV: 88.9 fL (ref 78.0–100.0)
MONO ABS: 1 10*3/uL (ref 0.1–1.0)
MONOS PCT: 10 %
Neutro Abs: 7 10*3/uL (ref 1.7–7.7)
Neutrophils Relative %: 72 %
PLATELETS: 256 10*3/uL (ref 150–400)
RBC: 4.07 MIL/uL — ABNORMAL LOW (ref 4.22–5.81)
RDW: 12.8 % (ref 11.5–15.5)
WBC: 9.9 10*3/uL (ref 4.0–10.5)

## 2015-09-14 LAB — BASIC METABOLIC PANEL
Anion gap: 8 (ref 5–15)
BUN: 13 mg/dL (ref 6–20)
CO2: 25 mmol/L (ref 22–32)
CREATININE: 0.73 mg/dL (ref 0.61–1.24)
Calcium: 11 mg/dL — ABNORMAL HIGH (ref 8.9–10.3)
Chloride: 104 mmol/L (ref 101–111)
GFR calc Af Amer: >60 mL/min (ref >60)
GLUCOSE: 116 mg/dL — AB (ref 65–99)
Potassium: 3.8 mmol/L (ref 3.5–5.1)
SODIUM: 137 mmol/L (ref 135–145)

## 2015-09-14 MED ORDER — VANCOMYCIN HCL IN DEXTROSE 750-5 MG/150ML-% IV SOLN
750.0000 mg | Freq: Once | INTRAVENOUS | Status: AC
  Administered 2015-09-14: 750 mg via INTRAVENOUS
  Filled 2015-09-14: qty 150

## 2015-09-14 MED ORDER — LIDOCAINE-EPINEPHRINE 2 %-1:100000 IJ SOLN
INTRAMUSCULAR | Status: AC
  Administered 2015-09-14: 10 mL
  Filled 2015-09-14: qty 1

## 2015-09-14 MED ORDER — LIDOCAINE-EPINEPHRINE (PF) 2 %-1:200000 IJ SOLN
10.0000 mL | Freq: Once | INTRAMUSCULAR | Status: DC
Start: 2015-09-14 — End: 2015-09-15

## 2015-09-14 NOTE — ED Notes (Signed)
Patient c/o left knee and,right upper abdomen abscess x 7-10 days. Patient states has gotten progressively worse.

## 2015-09-14 NOTE — ED Provider Notes (Signed)
CSN: [id number]     Arrival date & time 09/14/15  1840 History   First MD Initiated Contact with Patient 09/14/15 2121     Chief Complaint  Patient presents with  . Abscess     (Consider location/radiation/quality/duration/timing/severity/associated sxs/prior Treatment) HPI   Patient is a 65 year old male past medical history of arthritis, high cholesterol, prostate cancer (currently being follow by [facility]) who presents to the ED today with complaint of multiple abscesses. Patient reports having an abscess to his right lower chest and left knee that he states have been worsening over the past 10 days. He notes he has tried to drain the abscess is at home with palpation without resolution. Endorses having small amount of present drainage from both abscesses. Patient reports having redness around those lesions but notes he has been having worsening swelling to his left knee. Patient denies any trauma or injury to the areas. Endorses associated subjective fever and generalized weakness over the past few days. Denies fever, chills, Chest pain, shortness of breath, cough, abdominal pain, nausea, vomiting, diarrhea, numbness, tingling, weakness. Patient reports he has been using over-the-counter antibiotic ointment without relief.  Past Medical History  Diagnosis Date  . Arthritis     shoulders  . Hyperlipidemia 12/06/2014  . Prostate cancer [facility]) urologist--  dr wrenn/  oncologist-- dr Tammi Klippel    Stage T1c, Gleason 3+4, PSA 7.4  . Alcoholism in recovery ([place])   . Frequency of urination   . Wears glasses    Past Surgical History  Procedure Laterality Date  . Inguinal hernia repair Left 10/18/2014    Procedure: OPEN LEFT INGUINAL HERNIA REPAIR WITH MESH;  Surgeon: [name], MD;  Location: [place];  Service: General;  Laterality: Left;  . Insertion of mesh Left 10/18/2014    Procedure: INSERTION OF MESH;  Surgeon: [name], MD;  Location: [facility];  Service: General;  Laterality:  Left;  . Prostate biopsy    . Radioactive seed implant N/A 06/02/2015    Procedure: RADIOACTIVE SEED IMPLANT/BRACHYTHERAPY IMPLANT;  Surgeon: Irine Seal, MD;  Location: [facility];  Service: Urology;  Laterality: N/A;   70 SEEDS IMPLANTED  . Cystoscopy N/A 06/02/2015    Procedure: CYSTOSCOPY FLEXIBLE;  Surgeon: Irine Seal, MD;  Location: [facility];  Service: Urology;  Laterality: N/A;  1 SEEDS FOUND IN BLADDER and removed   Family History  Problem Relation Age of Onset  . Heart disease Mother   . Colon cancer Neg Hx   . Rectal cancer Neg Hx   . Stomach cancer Neg Hx   . Esophageal cancer Neg Hx    Social History  Substance Use Topics  . Smoking status: Former Smoker -- 1.00 packs/day for 40 years    Types: Cigarettes    Quit date: 08/20/2007  . Smokeless tobacco: Current User    Types: Snuff  . Alcohol Use: No     Comment: RECOVERING ALCOHOLIC -- QUIT    [id number]    Review of Systems  Constitutional: Positive for fever (subjective).  Musculoskeletal: Positive for joint swelling (left knee) and arthralgias (left knee).  Skin:       Abscess  Neurological: Positive for weakness (generalized).  All other systems reviewed and are negative.     Allergies  Review of patient's allergies indicates no known allergies.  Home Medications   Prior to Admission medications   Medication Sig Start Date End Date Taking? Authorizing Provider  acetaminophen (TYLENOL) 650 MG CR tablet Take  650 mg by mouth every 8 (eight) hours as needed for pain.   Yes Historical Provider, MD  chlorpheniramine (CHLOR-TRIMETON) 4 MG tablet Take 4 mg by mouth every 6 (six) hours as needed for allergies.   Yes Historical Provider, MD  tamsulosin (FLOMAX) 0.4 MG CAPS capsule Take 1 capsule (0.4 mg total) by mouth daily. 06/02/15  Yes Irine Seal, MD  clindamycin (CLEOCIN) 150 MG capsule Take 2 capsules (300 mg total) by mouth 3 (three) times daily. May dispense as 150mg   capsules 09/15/15   Nona Dell, PA-C  HYDROcodone-acetaminophen [facility]) 5-325 MG tablet Take 1 tablet by mouth every 6 (six) hours as needed for moderate pain. Patient not taking: Reported on 06/23/2015 06/02/15   Irine Seal, MD  simvastatin (ZOCOR) 20 MG tablet Take 1 tablet (20 mg total) by mouth at bedtime. Patient not taking: Reported on 06/23/2015 12/06/14   [name], PA-C   BP 146/82 mmHg  Pulse 74  Temp(Src) 98.1 F (36.7 C) (Oral)  Resp 18  SpO2 92% Physical Exam  Constitutional: He is oriented to person, place, and time. He appears well-developed and well-nourished. No distress.  HENT:  Head: Normocephalic and atraumatic.  Mouth/Throat: Oropharynx is clear and moist. No oropharyngeal exudate.  Eyes: Conjunctivae and EOM are normal. Right eye exhibits no discharge. Left eye exhibits no discharge. No scleral icterus.  Neck: Normal range of motion. Neck supple.  Cardiovascular: Normal rate, regular rhythm, normal heart sounds and intact distal pulses.   Pulmonary/Chest: Effort normal and breath sounds normal. No respiratory distress. He has no wheezes. He has no rales. He exhibits no tenderness.  Abdominal: Soft. Bowel sounds are normal. He exhibits no distension and no mass. There is no tenderness. There is no rebound and no guarding.  Musculoskeletal: Normal range of motion.       Left knee: He exhibits swelling and erythema. He exhibits normal range of motion, no effusion, no ecchymosis, no deformity, no laceration, normal alignment, no LCL laxity, normal patellar mobility and no MCL laxity. No tenderness found.  Lymphadenopathy:    He has no cervical adenopathy.  Neurological: He is alert and oriented to person, place, and time.  Skin: Skin is warm and dry. He is not diaphoretic.  1cm erythematous lesion noted to right anterior lower chest wall with small amount of clear/purulent drainage notes, mild surrounding erythema present. No swelling or warmth noted.    2cm erythematous lesion noted to left medial knee with surrounding erythema, swelling and warmth noted. Lesion actively draining purulent drainage. Moderate swelling noted to left anterior knee.   Nursing note and vitals reviewed.   ED Course  Procedures (including critical care time) Labs Review Labs Reviewed  CBC WITH DIFFERENTIAL/PLATELET - Abnormal; Notable for the following:    RBC 4.07 (*)    Hemoglobin 12.0 (*)    HCT 36.2 (*)    All other components within normal limits  BASIC METABOLIC PANEL - Abnormal; Notable for the following:    Glucose, Bld 116 (*)    Calcium 11.0 (*)    All other components within normal limits    Imaging Review Dg Knee Complete 4 Views Left  09/14/2015  CLINICAL DATA:  65 year old male with inflammation of the medial aspect of the left knee. EXAM: LEFT KNEE - COMPLETE 4+ VIEW COMPARISON:  None. FINDINGS: There is no acute fracture or dislocation. Mild osteopenia. No significant joint effusion. There is soft tissue swelling of the medial aspect of the left knee. No radiopaque foreign  object identified. Ultrasound may provide better evaluation of soft tissues. IMPRESSION: No acute osseous pathology. Soft tissue swelling of the middle aspect of the left knee. Ultrasound may provide better evaluation. Electronically Signed   By: Anner Crete M.D.   On: 09/14/2015 22:23   I have personally reviewed and evaluated these images and lab results as part of my medical decision-making.  INCISION AND DRAINAGE Performed by: Nona Dell Consent: Verbal consent obtained. Risks and benefits: risks, benefits and alternatives were discussed Type: abscess  Body area: left medial knee  Anesthesia: local infiltration  Incision was made with a scalpel.  Local anesthetic: lidocaine 2% with epinephrine  Anesthetic total: 2 ml  Complexity: complex Blunt dissection to break up loculations  Drainage: purulent  Drainage amount: small  Packing  material: none  Patient tolerance: Patient tolerated the procedure well with no immediate complications.    Filed Vitals:   09/14/15 2300 09/15/15 0000  BP: 148/89 146/82  Pulse: 77 74  Temp:    Resp:  18     MDM   Final diagnoses:  Abscess and cellulitis    Patient presents with abscess to right anterior chest and left knee. Denies fever. VSS. Exam revealed erythematous lesion to right anterior lower chest wall with purulent drainage, erythematous lesion noted to left medial knee with surrounding swelling erythema and warmth consistent with abscess with cellulitis. WBC 9.9. Hgb 12. Left knee x-ray revealed soft tissue swelling the medial aspect of left knee. I&D performed to left knee abscess without any complications. Patient given 1 dose of IV Vanco in the ED. Plan to discharge patient home with clindamycin. Cellulitis marked with skin pen. Advised patient to return to the emergency department in 24 hours for wound recheck. Discussed results and plan for discharge with patient. Discussed wound care. Patient given resource guide to establish care with PCP.  Evaluation does not show pathology requring ongoing emergent intervention or admission. Pt is hemodynamically stable and mentating appropriately. Discussed findings/results and plan with patient/guardian, who agrees with plan. All questions answered. Return precautions discussed and outpatient follow up given.          Chesley Noon [place], [place] 09/15/15 0013  Milton Ferguson, MD 09/15/15 1058

## 2015-09-15 ENCOUNTER — Encounter (HOSPITAL_COMMUNITY): Payer: Self-pay | Admitting: Emergency Medicine

## 2015-09-15 ENCOUNTER — Emergency Department (HOSPITAL_COMMUNITY)
Admission: EM | Admit: 2015-09-15 | Discharge: 2015-09-15 | Disposition: A | Discharge status: Home / Self Care | Payer: Medicare HMO | Source: Home / Self Care | Attending: Emergency Medicine | Admitting: Emergency Medicine

## 2015-09-15 DIAGNOSIS — L02416 Cutaneous abscess of left lower limb: Secondary | ICD-10-CM | POA: Diagnosis not present

## 2015-09-15 DIAGNOSIS — Z8639 Personal history of other endocrine, nutritional and metabolic disease: Secondary | ICD-10-CM

## 2015-09-15 DIAGNOSIS — Z973 Presence of spectacles and contact lenses: Secondary | ICD-10-CM | POA: Diagnosis not present

## 2015-09-15 DIAGNOSIS — L03116 Cellulitis of left lower limb: Secondary | ICD-10-CM | POA: Diagnosis not present

## 2015-09-15 DIAGNOSIS — L03313 Cellulitis of chest wall: Secondary | ICD-10-CM | POA: Diagnosis not present

## 2015-09-15 DIAGNOSIS — Z87891 Personal history of nicotine dependence: Secondary | ICD-10-CM

## 2015-09-15 DIAGNOSIS — M19011 Primary osteoarthritis, right shoulder: Secondary | ICD-10-CM | POA: Insufficient documentation

## 2015-09-15 DIAGNOSIS — Z79899 Other long term (current) drug therapy: Secondary | ICD-10-CM | POA: Insufficient documentation

## 2015-09-15 DIAGNOSIS — Z8546 Personal history of malignant neoplasm of prostate: Secondary | ICD-10-CM | POA: Insufficient documentation

## 2015-09-15 DIAGNOSIS — L0291 Cutaneous abscess, unspecified: Secondary | ICD-10-CM

## 2015-09-15 DIAGNOSIS — M19012 Primary osteoarthritis, left shoulder: Secondary | ICD-10-CM

## 2015-09-15 DIAGNOSIS — L02213 Cutaneous abscess of chest wall: Secondary | ICD-10-CM | POA: Diagnosis not present

## 2015-09-15 MED ORDER — LIDOCAINE-EPINEPHRINE (PF) 2 %-1:200000 IJ SOLN
INTRAMUSCULAR | Status: AC
  Administered 2015-09-15: 20 mL
  Filled 2015-09-15: qty 20

## 2015-09-15 MED ORDER — LIDOCAINE-EPINEPHRINE (PF) 2 %-1:200000 IJ SOLN: 10.0000 mL | Freq: Once | INTRAMUSCULAR | Status: DC

## 2015-09-15 MED ORDER — CLINDAMYCIN HCL 150 MG PO CAPS: 300.0000 mg | ORAL_CAPSULE | Freq: Three times a day (TID) | ORAL | Status: DC

## 2015-09-15 NOTE — Discharge Instructions (Signed)

## 2015-09-15 NOTE — ED Notes (Addendum)
Pt c/o open edematous cyst with white purulent area to center, surrounded by erythema. Erythema markedly smaller than yesterday, pt reports edema has decreased since yesterday. Pt was told to return for follow up. Patient has radioactive seed implants in his prostate. No other chemo or radiation.

## 2015-09-15 NOTE — ED Provider Notes (Signed)
CSN: [id number]     Arrival date & time 09/15/15  1556 History   First MD Initiated Contact with Patient 09/15/15 1756     Chief Complaint  Patient presents with  . Knee Pain    HPI Patient presents to the emergency room for follow-up on an abscess on his left knee. The patient's had several areas of similar symptoms in the past. He noticed the area on his knee progressing over the past week. He was seen in the emergency room yesterday for evaluation. Patient had an incision and drainage performed. Since the procedure the patient states the swelling and redness has decreased. He feels it is getting better. Continues to have some drainage from the site. Patient is concerned though because the wound seems to be getting smaller and he wants to make sure it does not close up. Overall he is feeling much better. Past Medical History  Diagnosis Date  . Arthritis     shoulders  . Hyperlipidemia 12/06/2014  . Prostate cancer [facility]) urologist--  dr wrenn/  oncologist-- dr Tammi Klippel    Stage T1c, Gleason 3+4, PSA 7.4  . Alcoholism in recovery ([place])   . Frequency of urination   . Wears glasses    Past Surgical History  Procedure Laterality Date  . Inguinal hernia repair Left 10/18/2014    Procedure: OPEN LEFT INGUINAL HERNIA REPAIR WITH MESH;  Surgeon: [name], MD;  Location: [facility];  Service: General;  Laterality: Left;  . Insertion of mesh Left 10/18/2014    Procedure: INSERTION OF MESH;  Surgeon: [name], MD;  Location: [facility];  Service: General;  Laterality: Left;  . Prostate biopsy    . Radioactive seed implant N/A 06/02/2015    Procedure: RADIOACTIVE SEED IMPLANT/BRACHYTHERAPY IMPLANT;  Surgeon: Irine Seal, MD;  Location: [facility];  Service: Urology;  Laterality: N/A;   70 SEEDS IMPLANTED  . Cystoscopy N/A 06/02/2015    Procedure: CYSTOSCOPY FLEXIBLE;  Surgeon: Irine Seal, MD;  Location: [facility];  Service: Urology;  Laterality: N/A;  1 SEEDS FOUND IN  BLADDER and removed   Family History  Problem Relation Age of Onset  . Heart disease Mother   . Colon cancer Neg Hx   . Rectal cancer Neg Hx   . Stomach cancer Neg Hx   . Esophageal cancer Neg Hx    Social History  Substance Use Topics  . Smoking status: Former Smoker -- 1.00 packs/day for 40 years    Types: Cigarettes    Quit date: 08/20/2007  . Smokeless tobacco: Current User    Types: Snuff  . Alcohol Use: No     Comment: RECOVERING ALCOHOLIC -- QUIT    [id number]    Review of Systems  All other systems reviewed and are negative.     Allergies  Review of patient's allergies indicates no known allergies.  Home Medications   Prior to Admission medications   Medication Sig Start Date End Date Taking? Authorizing Provider  acetaminophen (TYLENOL) 650 MG CR tablet Take 650 mg by mouth every 8 (eight) hours as needed for pain.   Yes Historical Provider, MD  chlorpheniramine (CHLOR-TRIMETON) 4 MG tablet Take 4 mg by mouth every 6 (six) hours as needed for allergies.   Yes Historical Provider, MD  tamsulosin (FLOMAX) 0.4 MG CAPS capsule Take 1 capsule (0.4 mg total) by mouth daily. 06/02/15  Yes Irine Seal, MD  clindamycin (CLEOCIN) 150 MG capsule Take 2 capsules (300 mg total) by mouth  3 (three) times daily. May dispense as 150mg  capsules Patient not taking: Reported on 09/15/2015 09/15/15   Nona Dell, PA-C  HYDROcodone-acetaminophen [facility]) 5-325 MG tablet Take 1 tablet by mouth every 6 (six) hours as needed for moderate pain. Patient not taking: Reported on 06/23/2015 06/02/15   Irine Seal, MD  simvastatin (ZOCOR) 20 MG tablet Take 1 tablet (20 mg total) by mouth at bedtime. Patient not taking: Reported on 06/23/2015 12/06/14   [name], PA-C   BP 135/88 mmHg  Pulse 73  Temp(Src) 97.7 F (36.5 C) (Oral)  Resp 16  SpO2 95% Physical Exam  Constitutional: He appears well-developed and well-nourished. No distress.  HENT:  Head: Normocephalic and  atraumatic.  Right Ear: External ear normal.  Left Ear: External ear normal.  Eyes: Conjunctivae are normal. Right eye exhibits no discharge. Left eye exhibits no discharge. No scleral icterus.  Neck: Neck supple. No tracheal deviation present.  Cardiovascular: Normal rate.   Pulmonary/Chest: Effort normal. No stridor. No respiratory distress.  Musculoskeletal: He exhibits no edema.       Left knee: He exhibits swelling and erythema. He exhibits no effusion. Tenderness found.  Small area of erythema surrounding a central draining pustule, full ROM, erythema has regressed from the markings drawn on the skin yesterday  Neurological: He is alert. Cranial nerve deficit: no gross deficits.  Skin: Skin is warm and dry. No rash noted.  Psychiatric: He has a normal mood and affect.  Nursing note and vitals reviewed.   ED Course  .Marland KitchenIncision and Drainage Date/Time: 09/16/2015 12:00 AM Performed by: Dorie Rank Authorized by: Dorie Rank Consent: Verbal consent obtained. Risks and benefits: risks, benefits and alternatives were discussed Consent given by: patient Type: abscess Body area: lower extremity Anesthesia: local infiltration Local anesthetic: lidocaine 1% with epinephrine Patient sedated: no Scalpel size: 11 Incision type: single straight Incision depth: subcutaneous Complexity: complex Drainage: purulent Drainage amount: scant Packing material: 1/4 in iodoform gauze Patient tolerance: Patient tolerated the procedure well with no immediate complications   (including critical care time) Labs Review Labs Reviewed - No data to display  Imaging Review Dg Knee Complete 4 Views Left  09/14/2015  CLINICAL DATA:  65 year old male with inflammation of the medial aspect of the left knee. EXAM: LEFT KNEE - COMPLETE 4+ VIEW COMPARISON:  None. FINDINGS: There is no acute fracture or dislocation. Mild osteopenia. No significant joint effusion. There is soft tissue swelling of the medial  aspect of the left knee. No radiopaque foreign object identified. Ultrasound may provide better evaluation of soft tissues. IMPRESSION: No acute osseous pathology. Soft tissue swelling of the middle aspect of the left knee. Ultrasound may provide better evaluation. Electronically Signed   By: Anner Crete M.D.   On: 09/14/2015 22:23   I have personally reviewed and evaluated these images and lab results as part of my medical decision-making.    MDM   Final diagnoses:  Abscess    Continue medications and warm compresses.  I&D extended to allow for continued drainage.  Overall appears to be improving.   Dorie Rank, MD 09/16/15 0001

## 2015-09-15 NOTE — Discharge Instructions (Signed)
Take your medications as prescribed. Continue keeping wound clean using soap and water.  Return to the emergency department in 24 hours for wound recheck.  I have also listed the contact information for the [facility] to establish care with a primary care provider. Please return to the Emergency Department if symptoms worsen or new onset of fever, CP, SOB, abdominal pain, vomiting, drainage, worsening swelling/redness, numbness, tingling, weakness.   Emergency Department Resource Guide 1) Find a Doctor and Pay Out of Pocket Although you won't have to find out who is covered by your insurance plan, it is a good idea to ask around and get recommendations. You will then need to call the office and see if the doctor you have chosen will accept you as a new patient and what types of options they offer for patients who are self-pay. Some doctors offer discounts or will set up payment plans for their patients who do not have insurance, but you will need to ask so you aren't surprised when you get to your appointment.  2) Contact Your Local Health Department Not all health departments have doctors that can see patients for sick visits, but many do, so it is worth a call to see if yours does. If you don't know where your local health department is, you can check in your phone book. The CDC also has a tool to help you locate your state's health department, and many state websites also have listings of all of their local health departments.  3) Find a [facility] If your illness is not likely to be very severe or complicated, you may want to try a walk in clinic. These are popping up all over the country in pharmacies, drugstores, and shopping centers. They're usually staffed by nurse practitioners or physician assistants that have been trained to treat common illnesses and complaints. They're usually fairly quick and inexpensive. However, if you have serious medical issues or chronic medical  problems, these are probably not your best option.  No Primary Care Doctor: - Call Health Connect at  [phone] - they can help you locate a primary care doctor that  accepts your insurance, provides certain services, etc. - Physician Referral Service- [phone]  Chronic Pain Problems: Organization         Address  Phone   Notes  [facility]  [phone] Patients need to be referred by their primary care doctor.   Medication Assistance: Organization         Address  Phone   Notes  [facility] Medication [facility] [address] [place], [place] [address] [phone] --Must be a resident of [facility] -- Must have NO insurance coverage whatsoever (no Medicaid/ Medicare, etc.) -- The pt. MUST have a primary care doctor that directs their care regularly and follows them in the community   [facility]  [phone]   [employer]  [phone]    Agencies that provide inexpensive medical care: Organization         Address  Phone   Notes  [place]  [phone]   [facility]    [phone]   [facility] [place], [place] [address] [phone]   [place] [address], [place] [phone]   Planned Parenthood    [phone]   [facility]    [phone]   [facility] ° [address], [place] Phone:  ([phone]) [phone], Fax:  [phone] Hours of Operation:  9 am - 6 pm, M-F.  Also accepts Medicaid/Medicare and self-pay.  °[facility] ° [address], [place] Phone: ([phone]) [phone], Fax: [phone]. Hours of Operation:  8:30 am - 5:30 pm, M-F.  Also accepts Medicaid and self-pay.  °[facility] [place] [address], [place] Phone: ([phone]) [phone]   °[facility] [address], [place], [place] (336)[phone], Ext. 123  Mondays & Thursdays: 7-9 AM.  First 15 patients are seen on a first come, first serve basis. °  ° °Medicaid-accepting [place] Providers: ° °Organization         Address  Phone   Notes  °[facility] [address], [place] ([phone]) [phone] Also accepts self-pay patients.  °[facility] [address], [place] ° ([phone]) [phone]   °[facility] [address], [place] ([phone]) [phone]   °[facility] [address] [place] [address], [place] ([phone]) [phone]   °Veita Bland [address], [place]  ° ([phone]) [phone] Only accepts [place] Access Medicaid patients after they have their name applied to their card.  ° °Self-Pay (no insurance) in [place]: ° °Organization         Address  Phone   Notes  °Sickle Cell Patients, [facility] [address], [place] ([phone]) [phone]   °[facility] [address], [place] (336) [phone]   °[facility] ° [address], [place] ([phone]) [phone]   °[facility]/Dr. Osei-Bonsu ° [address] [place] [address], [place] or [address], [place] ([phone]) [phone] Phone number for both [place] and [place] locations is the same.  °[facility] [address], [place] ([phone]) [phone]   °[facility] [address] [place] [address], [place] or [address] (336) [phone] °(336) [phone]   °[facility] [address],  (336) [phone], phone; (336) [phone], fax Sees patients 1st and 3rd Saturday of every month.  Must not qualify for public or private insurance (i.e. Medicaid, Medicare, [place] Health Choice, Veterans' Benefits) • Household income should be no more than 200% of the poverty level •The clinic cannot treat you if you are pregnant or think you are pregnant • Sexually transmitted diseases are not treated at  the clinic.  ° ° °Dental Care: °Organization         Address  Phone  Notes  °[place] [facility] [address],  (336) [phone] Accepts children up to age 21 who are enrolled in Medicaid or [place] Health Choice; pregnant women with a Medicaid card; and children who have applied for Medicaid or [place] Health Choice, but were declined, whose parents can pay a reduced fee at time of service.  °[place] [facility] [place]  [address], [place] (336) 641-7733 Accepts children up to age 21 who are enrolled in Medicaid or [place] Health Choice; pregnant women with a Medicaid card; and children who have applied for Medicaid or [place] Health Choice, but were declined, whose parents can pay a reduced fee at time of service.  °[facility] ° [address], [place] ([phone]) [phone] Patients are seen by appointment only. Walk-ins are not accepted. [facility] will see patients 18 years of age and older. °Monday - Tuesday (8am-5pm) °Most Wednesdays (8:30-5pm) °$30 per visit, cash only  °[facility] ° [address], [place] ([phone]) [phone] Patients are seen by appointment only. Walk-ins are not accepted. [facility] will see patients 18 years of age and older. °One Wednesday Evening (Monthly: Volunteer Based).  $30 per visit, cash only  °[facility]  ([phone]) [phone] for adults; Children under age 4, call Graduate Pediatric Dentistry at ([phone]. Children aged 4-14, please call [phone]) [phone] to request a pediatric application. ° Dental services are provided in all areas of dental care including fillings, crowns and bridges, complete and partial dentures, implants, gum treatment, root canals, and extractions. Preventive care is also provided. Treatment is provided to both adults and children. °Patients are selected via a lottery and there is often a  waiting list. °  °[facility] [address], °[place] ° (336) 763-8833 [email or web address] °  °[facility] [address], [place], [place] (336)[phone], Ext. 123 Second and Fourth Thursday of each month, opens at 6:30 AM; Clinic ends at 9 AM.  Patients are seen on a first-come first-served basis, and a limited number are seen during each clinic.  ° °[facility] ° [address], [place], [place] (336) [phone]   Eligibility Requirements °You must have lived in [place], [place], or [place] counties for at least the last three months. °  You cannot be eligible for state or federal sponsored healthcare insurance, including Veterans Administration, Medicaid, or Medicare. °  You generally cannot be eligible for healthcare insurance through your employer.  °  How to apply: °Eligibility screenings are held every Tuesday and Wednesday afternoon from 1:00 pm until 4:00 pm. You do not need an appointment for the interview!  °[facility] [address], [place], [place] [phone]   °[facility]  336-342-8273   °[facility]  336-703-3100   °[facility]  336-570-6415   ° °Behavioral Health Resources in the Community: °Intensive Outpatient Programs °Organization         Address  Phone  Notes  °[facility] [address], [place], [place] 336-878-6098   °[facility] [address], [place], [place] 336-832-9800   °[facility] [address], [place], [place] ° 336-882-2125   °[facility] [address],  °[place], [place] 1-800-853-5163 or 336-641-4981   °Substance Abuse Resources °Organization         Address  Phone  Notes  °[facility]  336-882-2125   °[facility]  336-784-9470   °[facility]  336-285-9073   °[facility]  336-845-3988   °Residential & Outpatient Substance Abuse  Program  1-800-659-3381   °Psychological Services °Organization         Address  Phone  Notes  °[facility]  336- 832-9600   °[facility]  336- 378-7881   °[facility] [address], [place] 1-800-853-5163 or 336-641-4981   ° °Mobile Crisis Teams °Organization         Address  Phone  Notes  °[facility], Mobile Crisis Care Unit  1-877-626-1772   °Assertive °[facility] ° [address] [place], [place] 336-834-9664   °Sharon DeEsch   [place] (414)619-6844    Self-Help/Support Groups Organization         Address  Phone             Notes  [facility] [place] - variety of support groups  [place] Call for more information  Narcotics Anonymous (NA), [facility] [address], [employer] [place]  2 meetings at this location   Special educational needs teacher         Address  Phone  Notes  [facility] [place],    [place]  1-934-603-2039   [facility]  [address] [place] [id number], [place], [place]   [place], [place] [phone] Admissions: 8am-3pm M-F  Incentives Substance [place] [address]    [place], [place] [phone]   [facility] [address] [place], [place], [place]   [facility] [address],  [place], [place]   [facility] - Intensive Outpatient [address], [place], [place]   [facility] [address]  [place], [place] 1-312-833-8024 or (661) 322-6910   [facility] [address] [place], [place] Accepts Medicaid  [facility] [place] [address]  [place] 1-215-460-6473 Substance Abuse/Addiction Treatment   [facility] Organization          Address  Phone  Notes  [facility]  912-727-1842   Domenic Schwab, PhD [address] [place]lis [place], [place]   506-211-5170 or 678-070-5890   [place], [place] 704-088-7044   [facility] [address] [place], [place] 682-293-2999 Insurance/Medicaid/sponsorship through [facility] and Families [address]                                    [place], [place] (336)687-3436 [place] [address] [place] [address], [place] ([phone]) [phone]    Dr. Adele Schilder  303-333-9805   [facility] Dept. 1) [address] [place] 2) [place] 3)  [place] [address], [place] [phone]  [phone]   [place] 520-146-6340 or [phone] (After Hours)

## 2015-09-20 DIAGNOSIS — C61 Malignant neoplasm of prostate: Secondary | ICD-10-CM | POA: Diagnosis not present

## 2015-09-28 DIAGNOSIS — R0602 Shortness of breath: Secondary | ICD-10-CM | POA: Diagnosis not present

## 2015-09-28 DIAGNOSIS — Z0000 Encounter for general adult medical examination without abnormal findings: Secondary | ICD-10-CM | POA: Diagnosis not present

## 2015-09-28 DIAGNOSIS — Z79899 Other long term (current) drug therapy: Secondary | ICD-10-CM | POA: Diagnosis not present

## 2015-09-28 DIAGNOSIS — E7800 Pure hypercholesterolemia, unspecified: Secondary | ICD-10-CM | POA: Diagnosis not present

## 2015-09-28 DIAGNOSIS — R351 Nocturia: Secondary | ICD-10-CM | POA: Diagnosis not present

## 2015-09-28 DIAGNOSIS — R5383 Other fatigue: Secondary | ICD-10-CM | POA: Diagnosis not present

## 2015-09-28 DIAGNOSIS — R350 Frequency of micturition: Secondary | ICD-10-CM | POA: Diagnosis not present

## 2015-09-28 DIAGNOSIS — R3914 Feeling of incomplete bladder emptying: Secondary | ICD-10-CM | POA: Diagnosis not present

## 2015-09-28 DIAGNOSIS — N401 Enlarged prostate with lower urinary tract symptoms: Secondary | ICD-10-CM | POA: Diagnosis not present

## 2015-09-28 DIAGNOSIS — C61 Malignant neoplasm of prostate: Secondary | ICD-10-CM | POA: Diagnosis not present

## 2015-09-28 DIAGNOSIS — Z1389 Encounter for screening for other disorder: Secondary | ICD-10-CM | POA: Diagnosis not present

## 2015-09-28 DIAGNOSIS — E559 Vitamin D deficiency, unspecified: Secondary | ICD-10-CM | POA: Diagnosis not present

## 2015-10-03 DIAGNOSIS — L03115 Cellulitis of right lower limb: Secondary | ICD-10-CM | POA: Diagnosis not present

## 2015-10-03 DIAGNOSIS — E559 Vitamin D deficiency, unspecified: Secondary | ICD-10-CM | POA: Diagnosis not present

## 2015-10-03 DIAGNOSIS — Z23 Encounter for immunization: Secondary | ICD-10-CM | POA: Diagnosis not present

## 2015-10-03 DIAGNOSIS — E7800 Pure hypercholesterolemia, unspecified: Secondary | ICD-10-CM | POA: Diagnosis not present

## 2015-10-18 DIAGNOSIS — Z0000 Encounter for general adult medical examination without abnormal findings: Secondary | ICD-10-CM | POA: Diagnosis not present

## 2015-10-18 DIAGNOSIS — R300 Dysuria: Secondary | ICD-10-CM | POA: Diagnosis not present

## 2015-10-18 DIAGNOSIS — R3912 Poor urinary stream: Secondary | ICD-10-CM | POA: Diagnosis not present

## 2015-10-19 DIAGNOSIS — Z122 Encounter for screening for malignant neoplasm of respiratory organs: Secondary | ICD-10-CM | POA: Diagnosis not present

## 2015-10-26 DIAGNOSIS — C61 Malignant neoplasm of prostate: Secondary | ICD-10-CM | POA: Diagnosis not present

## 2015-10-26 DIAGNOSIS — E7800 Pure hypercholesterolemia, unspecified: Secondary | ICD-10-CM | POA: Diagnosis not present

## 2015-10-26 DIAGNOSIS — E559 Vitamin D deficiency, unspecified: Secondary | ICD-10-CM | POA: Diagnosis not present

## 2015-10-26 DIAGNOSIS — R635 Abnormal weight gain: Secondary | ICD-10-CM | POA: Diagnosis not present

## 2016-01-09 DIAGNOSIS — C61 Malignant neoplasm of prostate: Secondary | ICD-10-CM | POA: Diagnosis not present

## 2016-01-18 DIAGNOSIS — C61 Malignant neoplasm of prostate: Secondary | ICD-10-CM | POA: Diagnosis not present

## 2016-01-18 DIAGNOSIS — R350 Frequency of micturition: Secondary | ICD-10-CM | POA: Diagnosis not present

## 2016-01-18 DIAGNOSIS — N401 Enlarged prostate with lower urinary tract symptoms: Secondary | ICD-10-CM | POA: Diagnosis not present

## 2016-02-10 ENCOUNTER — Telehealth: Payer: Self-pay

## 2016-02-10 NOTE — Telephone Encounter (Signed)
Aundria Mems want to know if I am pt pcp. Would you call him and see who is his pcp. He has not been seen in a while. Did he find new pcp. If he states he is my pt then would you have him schedule follow up. Notfy Monia Pouch if I am his pcp or not. She wanted to know.

## 2016-02-10 NOTE — Telephone Encounter (Signed)
Pt is on my [employer] 2017 list. He is listed as being your pt however, there is no PCP listed in EPIC.  Can you take few moment to review chart and let me know if pt is your?  I can change if he is.   Thank you in advance for your time and attention.

## 2016-02-13 NOTE — Telephone Encounter (Signed)
Pt has pcp closer to his home. So he is no longer my pt. Stefannie Cairirrkier Monia Pouch CMA wanted to know so will you send her a message.

## 2016-02-13 NOTE — Telephone Encounter (Signed)
Spoke with pt and he states that he has a primary care doctor near his home. The patient also states that he received a letter that from [employer] that he was not longer accepting his insurance and that is why he did not return to see E. Saguier and that he does not plan to come back since he has a primary doctor close to home.

## 2016-02-14 NOTE — Telephone Encounter (Signed)
Thank you all for your help! I will note his in my documentation. Have a wonderful day!

## 2016-04-24 DIAGNOSIS — C61 Malignant neoplasm of prostate: Secondary | ICD-10-CM | POA: Diagnosis not present

## 2016-04-24 DIAGNOSIS — E7800 Pure hypercholesterolemia, unspecified: Secondary | ICD-10-CM | POA: Diagnosis not present

## 2016-04-24 DIAGNOSIS — E559 Vitamin D deficiency, unspecified: Secondary | ICD-10-CM | POA: Diagnosis not present

## 2016-04-25 DIAGNOSIS — N401 Enlarged prostate with lower urinary tract symptoms: Secondary | ICD-10-CM | POA: Diagnosis not present

## 2016-04-25 DIAGNOSIS — C61 Malignant neoplasm of prostate: Secondary | ICD-10-CM | POA: Diagnosis not present

## 2016-04-25 DIAGNOSIS — R3911 Hesitancy of micturition: Secondary | ICD-10-CM | POA: Diagnosis not present

## 2016-05-15 DIAGNOSIS — E559 Vitamin D deficiency, unspecified: Secondary | ICD-10-CM | POA: Diagnosis not present

## 2016-10-23 DIAGNOSIS — C61 Malignant neoplasm of prostate: Secondary | ICD-10-CM | POA: Diagnosis not present

## 2016-10-29 DIAGNOSIS — N401 Enlarged prostate with lower urinary tract symptoms: Secondary | ICD-10-CM | POA: Diagnosis not present

## 2016-10-29 DIAGNOSIS — C61 Malignant neoplasm of prostate: Secondary | ICD-10-CM | POA: Diagnosis not present

## 2016-11-21 DIAGNOSIS — M129 Arthropathy, unspecified: Secondary | ICD-10-CM | POA: Diagnosis not present

## 2016-11-21 DIAGNOSIS — M25511 Pain in right shoulder: Secondary | ICD-10-CM | POA: Diagnosis not present

## 2016-11-21 DIAGNOSIS — R5383 Other fatigue: Secondary | ICD-10-CM | POA: Diagnosis not present

## 2016-11-21 DIAGNOSIS — Z1389 Encounter for screening for other disorder: Secondary | ICD-10-CM | POA: Diagnosis not present

## 2016-11-21 DIAGNOSIS — Z0000 Encounter for general adult medical examination without abnormal findings: Secondary | ICD-10-CM | POA: Diagnosis not present

## 2016-11-21 DIAGNOSIS — R0602 Shortness of breath: Secondary | ICD-10-CM | POA: Diagnosis not present

## 2016-11-21 DIAGNOSIS — R7309 Other abnormal glucose: Secondary | ICD-10-CM | POA: Diagnosis not present

## 2016-11-21 DIAGNOSIS — Z131 Encounter for screening for diabetes mellitus: Secondary | ICD-10-CM | POA: Diagnosis not present

## 2016-11-21 DIAGNOSIS — Z79899 Other long term (current) drug therapy: Secondary | ICD-10-CM | POA: Diagnosis not present

## 2016-11-21 DIAGNOSIS — E7800 Pure hypercholesterolemia, unspecified: Secondary | ICD-10-CM | POA: Diagnosis not present

## 2016-11-21 DIAGNOSIS — E559 Vitamin D deficiency, unspecified: Secondary | ICD-10-CM | POA: Diagnosis not present

## 2016-12-15 DIAGNOSIS — Z6826 Body mass index (BMI) 26.0-26.9, adult: Secondary | ICD-10-CM | POA: Diagnosis not present

## 2016-12-15 DIAGNOSIS — C61 Malignant neoplasm of prostate: Secondary | ICD-10-CM | POA: Diagnosis not present

## 2016-12-15 DIAGNOSIS — R3915 Urgency of urination: Secondary | ICD-10-CM | POA: Diagnosis not present

## 2016-12-15 DIAGNOSIS — H259 Unspecified age-related cataract: Secondary | ICD-10-CM | POA: Diagnosis not present

## 2016-12-15 DIAGNOSIS — Z0000 Encounter for general adult medical examination without abnormal findings: Secondary | ICD-10-CM | POA: Diagnosis not present

## 2016-12-15 DIAGNOSIS — G4700 Insomnia, unspecified: Secondary | ICD-10-CM | POA: Diagnosis not present

## 2016-12-15 DIAGNOSIS — Z87891 Personal history of nicotine dependence: Secondary | ICD-10-CM | POA: Diagnosis not present

## 2016-12-15 DIAGNOSIS — H9113 Presbycusis, bilateral: Secondary | ICD-10-CM | POA: Diagnosis not present

## 2016-12-15 DIAGNOSIS — R42 Dizziness and giddiness: Secondary | ICD-10-CM | POA: Diagnosis not present

## 2016-12-15 DIAGNOSIS — M13111 Monoarthritis, not elsewhere classified, right shoulder: Secondary | ICD-10-CM | POA: Diagnosis not present

## 2016-12-21 DIAGNOSIS — Z1389 Encounter for screening for other disorder: Secondary | ICD-10-CM | POA: Diagnosis not present

## 2016-12-21 DIAGNOSIS — R7303 Prediabetes: Secondary | ICD-10-CM | POA: Diagnosis not present

## 2016-12-21 DIAGNOSIS — E559 Vitamin D deficiency, unspecified: Secondary | ICD-10-CM | POA: Diagnosis not present

## 2016-12-21 DIAGNOSIS — E7800 Pure hypercholesterolemia, unspecified: Secondary | ICD-10-CM | POA: Diagnosis not present

## 2016-12-28 DIAGNOSIS — L03114 Cellulitis of left upper limb: Secondary | ICD-10-CM | POA: Diagnosis not present

## 2016-12-28 DIAGNOSIS — E7800 Pure hypercholesterolemia, unspecified: Secondary | ICD-10-CM | POA: Diagnosis not present

## 2016-12-28 DIAGNOSIS — E559 Vitamin D deficiency, unspecified: Secondary | ICD-10-CM | POA: Diagnosis not present

## 2017-04-23 DIAGNOSIS — C61 Malignant neoplasm of prostate: Secondary | ICD-10-CM | POA: Diagnosis not present

## 2017-04-29 DIAGNOSIS — C61 Malignant neoplasm of prostate: Secondary | ICD-10-CM | POA: Diagnosis not present

## 2017-04-29 DIAGNOSIS — R3915 Urgency of urination: Secondary | ICD-10-CM | POA: Diagnosis not present

## 2017-04-29 DIAGNOSIS — N401 Enlarged prostate with lower urinary tract symptoms: Secondary | ICD-10-CM | POA: Diagnosis not present

## 2017-07-29 ENCOUNTER — Emergency Department (HOSPITAL_COMMUNITY): Payer: Medicare HMO

## 2017-07-29 ENCOUNTER — Other Ambulatory Visit: Payer: Self-pay

## 2017-07-29 ENCOUNTER — Encounter (HOSPITAL_COMMUNITY): Payer: Self-pay

## 2017-07-29 ENCOUNTER — Inpatient Hospital Stay (HOSPITAL_COMMUNITY)
Admission: EM | Admit: 2017-07-29 | Discharge: 2017-08-06 | DRG: 165 | Disposition: A | Discharge status: Home / Self Care | Payer: Medicare HMO | Attending: Cardiothoracic Surgery | Admitting: Cardiothoracic Surgery

## 2017-07-29 DIAGNOSIS — J9382 Other air leak: Secondary | ICD-10-CM | POA: Diagnosis present

## 2017-07-29 DIAGNOSIS — Z973 Presence of spectacles and contact lenses: Secondary | ICD-10-CM | POA: Diagnosis not present

## 2017-07-29 DIAGNOSIS — Z79899 Other long term (current) drug therapy: Secondary | ICD-10-CM

## 2017-07-29 DIAGNOSIS — F17290 Nicotine dependence, other tobacco product, uncomplicated: Secondary | ICD-10-CM | POA: Diagnosis present

## 2017-07-29 DIAGNOSIS — Z4682 Encounter for fitting and adjustment of non-vascular catheter: Secondary | ICD-10-CM

## 2017-07-29 DIAGNOSIS — J9311 Primary spontaneous pneumothorax: Secondary | ICD-10-CM | POA: Diagnosis not present

## 2017-07-29 DIAGNOSIS — M19012 Primary osteoarthritis, left shoulder: Secondary | ICD-10-CM | POA: Diagnosis present

## 2017-07-29 DIAGNOSIS — I459 Conduction disorder, unspecified: Secondary | ICD-10-CM | POA: Diagnosis present

## 2017-07-29 DIAGNOSIS — Z87891 Personal history of nicotine dependence: Secondary | ICD-10-CM | POA: Diagnosis not present

## 2017-07-29 DIAGNOSIS — J939 Pneumothorax, unspecified: Secondary | ICD-10-CM

## 2017-07-29 DIAGNOSIS — F1021 Alcohol dependence, in remission: Secondary | ICD-10-CM | POA: Diagnosis present

## 2017-07-29 DIAGNOSIS — R0602 Shortness of breath: Secondary | ICD-10-CM | POA: Diagnosis not present

## 2017-07-29 DIAGNOSIS — E785 Hyperlipidemia, unspecified: Secondary | ICD-10-CM | POA: Diagnosis not present

## 2017-07-29 DIAGNOSIS — J9383 Other pneumothorax: Principal | ICD-10-CM | POA: Diagnosis present

## 2017-07-29 DIAGNOSIS — R69 Illness, unspecified: Secondary | ICD-10-CM | POA: Diagnosis not present

## 2017-07-29 DIAGNOSIS — Z8249 Family history of ischemic heart disease and other diseases of the circulatory system: Secondary | ICD-10-CM

## 2017-07-29 DIAGNOSIS — R350 Frequency of micturition: Secondary | ICD-10-CM | POA: Diagnosis not present

## 2017-07-29 DIAGNOSIS — K4090 Unilateral inguinal hernia, without obstruction or gangrene, not specified as recurrent: Secondary | ICD-10-CM | POA: Diagnosis not present

## 2017-07-29 DIAGNOSIS — Z9689 Presence of other specified functional implants: Secondary | ICD-10-CM

## 2017-07-29 DIAGNOSIS — J439 Emphysema, unspecified: Secondary | ICD-10-CM | POA: Diagnosis present

## 2017-07-29 DIAGNOSIS — M19011 Primary osteoarthritis, right shoulder: Secondary | ICD-10-CM | POA: Diagnosis not present

## 2017-07-29 DIAGNOSIS — J189 Pneumonia, unspecified organism: Secondary | ICD-10-CM | POA: Diagnosis not present

## 2017-07-29 DIAGNOSIS — J90 Pleural effusion, not elsewhere classified: Secondary | ICD-10-CM | POA: Diagnosis not present

## 2017-07-29 DIAGNOSIS — Z09 Encounter for follow-up examination after completed treatment for conditions other than malignant neoplasm: Secondary | ICD-10-CM

## 2017-07-29 DIAGNOSIS — Z8546 Personal history of malignant neoplasm of prostate: Secondary | ICD-10-CM | POA: Diagnosis not present

## 2017-07-29 DIAGNOSIS — C61 Malignant neoplasm of prostate: Secondary | ICD-10-CM | POA: Diagnosis not present

## 2017-07-29 LAB — CBC
HCT: 45.1 % (ref 39.0–52.0)
Hemoglobin: 15.8 g/dL (ref 13.0–17.0)
MCH: 31.6 pg (ref 26.0–34.0)
MCHC: 35 g/dL (ref 30.0–36.0)
MCV: 90.2 fL (ref 78.0–100.0)
Platelets: 255 10*3/uL (ref 150–400)
RBC: 5 MIL/uL (ref 4.22–5.81)
RDW: 13.3 % (ref 11.5–15.5)
WBC: 11 10*3/uL — ABNORMAL HIGH (ref 4.0–10.5)

## 2017-07-29 LAB — CREATININE, SERUM
Creatinine, Ser: 0.79 mg/dL (ref 0.61–1.24)
GFR calc Af Amer: >60 mL/min (ref >60)
GFR calc non Af Amer: >60 mL/min (ref >60)

## 2017-07-29 LAB — COMPREHENSIVE METABOLIC PANEL
ALT: 13 U/L — ABNORMAL LOW (ref 17–63)
AST: 14 U/L — ABNORMAL LOW (ref 15–41)
Albumin: 4.3 g/dL (ref 3.5–5.0)
Alkaline Phosphatase: 63 U/L (ref 38–126)
Anion gap: 7 (ref 5–15)
BUN: 10 mg/dL (ref 6–20)
CHLORIDE: 104 mmol/L (ref 101–111)
CO2: 25 mmol/L (ref 22–32)
Calcium: 10.8 mg/dL — ABNORMAL HIGH (ref 8.9–10.3)
Creatinine, Ser: 0.78 mg/dL (ref 0.61–1.24)
GFR calc non Af Amer: >60 mL/min (ref >60)
Glucose, Bld: 97 mg/dL (ref 65–99)
POTASSIUM: 4.3 mmol/L (ref 3.5–5.1)
SODIUM: 136 mmol/L (ref 135–145)
Total Bilirubin: 0.8 mg/dL (ref 0.3–1.2)
Total Protein: 7.4 g/dL (ref 6.5–8.1)

## 2017-07-29 LAB — CBC WITH DIFFERENTIAL/PLATELET
BASOS ABS: 0 10*3/uL (ref 0.0–0.1)
Basophils Relative: 0 %
EOS ABS: 0.2 10*3/uL (ref 0.0–0.7)
EOS PCT: 2 %
HCT: 45.7 % (ref 39.0–52.0)
Hemoglobin: 16 g/dL (ref 13.0–17.0)
LYMPHS PCT: 18 %
Lymphs Abs: 1.6 10*3/uL (ref 0.7–4.0)
MCH: 31.5 pg (ref 26.0–34.0)
MCHC: 35 g/dL (ref 30.0–36.0)
MCV: 90 fL (ref 78.0–100.0)
MONO ABS: 0.9 10*3/uL (ref 0.1–1.0)
Monocytes Relative: 10 %
Neutro Abs: 6.3 10*3/uL (ref 1.7–7.7)
Neutrophils Relative %: 70 %
PLATELETS: 329 10*3/uL (ref 150–400)
RBC: 5.08 MIL/uL (ref 4.22–5.81)
RDW: 13.3 % (ref 11.5–15.5)
WBC: 8.9 10*3/uL (ref 4.0–10.5)

## 2017-07-29 LAB — PROTIME-INR
INR: 1
PROTHROMBIN TIME: 13.1 s (ref 11.4–15.2)

## 2017-07-29 LAB — I-STAT TROPONIN, ED: Troponin i, poc: 0 ng/mL (ref 0.00–0.08)

## 2017-07-29 LAB — BRAIN NATRIURETIC PEPTIDE: B NATRIURETIC PEPTIDE 5: 19.5 pg/mL (ref 0.0–100.0)

## 2017-07-29 MED ORDER — DOCUSATE SODIUM 100 MG PO CAPS
100.0000 mg | ORAL_CAPSULE | Freq: Two times a day (BID) | ORAL | Status: DC
  Administered 2017-07-29 – 2017-07-31 (×5): 100 mg via ORAL
  Filled 2017-07-29 (×6): qty 1

## 2017-07-29 MED ORDER — SODIUM CHLORIDE 0.9% FLUSH
3.0000 mL | Freq: Two times a day (BID) | INTRAVENOUS | Status: DC
  Administered 2017-07-30 – 2017-08-02 (×5): 3 mL via INTRAVENOUS

## 2017-07-29 MED ORDER — SIMVASTATIN 20 MG PO TABS: 20.0000 mg | ORAL_TABLET | Freq: Every day | ORAL | Status: DC

## 2017-07-29 MED ORDER — SODIUM CHLORIDE 0.9% FLUSH: 3.0000 mL | INTRAVENOUS | Status: DC | PRN

## 2017-07-29 MED ORDER — OXYCODONE HCL 5 MG PO TABS
5.0000 mg | ORAL_TABLET | ORAL | Status: DC | PRN
  Administered 2017-07-29 (×2): 5 mg via ORAL
  Filled 2017-07-29 (×2): qty 1

## 2017-07-29 MED ORDER — ENOXAPARIN SODIUM 40 MG/0.4ML ~~LOC~~ SOLN: 40.0000 mg | SUBCUTANEOUS | Status: DC

## 2017-07-29 MED ORDER — LIDOCAINE HCL (PF) 1 % IJ SOLN
INTRAMUSCULAR | Status: AC | PRN
  Administered 2017-07-29: 20 mL

## 2017-07-29 MED ORDER — TAMSULOSIN HCL 0.4 MG PO CAPS
0.4000 mg | ORAL_CAPSULE | Freq: Every day | ORAL | Status: DC
Start: 2017-07-29 — End: 2017-07-29

## 2017-07-29 MED ORDER — FENTANYL CITRATE (PF) 100 MCG/2ML IJ SOLN
INTRAMUSCULAR | Status: AC | PRN
  Administered 2017-07-29 (×2): 50 ug via INTRAVENOUS

## 2017-07-29 MED ORDER — ASPIRIN EC 81 MG PO TBEC
81.0000 mg | DELAYED_RELEASE_TABLET | Freq: Every day | ORAL | Status: DC
  Administered 2017-07-30 – 2017-08-06 (×7): 81 mg via ORAL
  Filled 2017-07-29 (×6): qty 1

## 2017-07-29 MED ORDER — FENTANYL CITRATE (PF) 100 MCG/2ML IJ SOLN
INTRAMUSCULAR | Status: AC
  Filled 2017-07-29: qty 2

## 2017-07-29 MED ORDER — SODIUM CHLORIDE 0.9 % IV SOLN: 250.0000 mL | INTRAVENOUS | Status: DC | PRN

## 2017-07-29 MED ORDER — MIDAZOLAM HCL 2 MG/2ML IJ SOLN
INTRAMUSCULAR | Status: AC | PRN
  Administered 2017-07-29 (×2): 1 mg via INTRAVENOUS

## 2017-07-29 MED ORDER — SODIUM CHLORIDE 0.9% FLUSH
3.0000 mL | Freq: Two times a day (BID) | INTRAVENOUS | Status: DC
  Administered 2017-07-29 – 2017-08-02 (×8): 3 mL via INTRAVENOUS

## 2017-07-29 MED ORDER — ALBUTEROL SULFATE (2.5 MG/3ML) 0.083% IN NEBU: 5.0000 mg | INHALATION_SOLUTION | Freq: Once | RESPIRATORY_TRACT | Status: DC

## 2017-07-29 MED ORDER — ENOXAPARIN SODIUM 40 MG/0.4ML ~~LOC~~ SOLN
40.0000 mg | SUBCUTANEOUS | Status: DC
  Administered 2017-07-30 – 2017-08-01 (×3): 40 mg via SUBCUTANEOUS
  Filled 2017-07-29 (×3): qty 0.4

## 2017-07-29 MED ORDER — MIDAZOLAM HCL 2 MG/2ML IJ SOLN
INTRAMUSCULAR | Status: AC
  Filled 2017-07-29: qty 4

## 2017-07-29 MED ORDER — KETOROLAC TROMETHAMINE 15 MG/ML IJ SOLN
15.0000 mg | Freq: Four times a day (QID) | INTRAMUSCULAR | Status: DC | PRN
  Administered 2017-07-29: 15 mg via INTRAVENOUS
  Filled 2017-07-29: qty 1

## 2017-07-29 MED ORDER — SORBITOL 70 % SOLN: 30.0000 mL | Freq: Every day | Status: DC | PRN

## 2017-07-29 MED ORDER — LEVALBUTEROL HCL 0.63 MG/3ML IN NEBU
0.6300 mg | INHALATION_SOLUTION | Freq: Four times a day (QID) | RESPIRATORY_TRACT | Status: DC
  Administered 2017-07-30 (×3): 0.63 mg via RESPIRATORY_TRACT
  Filled 2017-07-29 (×4): qty 3

## 2017-07-29 MED ORDER — FENTANYL CITRATE (PF) 100 MCG/2ML IJ SOLN
50.0000 ug | INTRAMUSCULAR | Status: DC | PRN
  Administered 2017-08-02: 50 ug via INTRAVENOUS
  Administered 2017-08-02 (×2): 100 ug via INTRAVENOUS
  Administered 2017-08-02: 50 ug via INTRAVENOUS
  Administered 2017-08-02: 100 ug via INTRAVENOUS
  Administered 2017-08-02 (×3): 50 ug via INTRAVENOUS
  Administered 2017-08-02: 100 ug via INTRAVENOUS

## 2017-07-29 NOTE — ED Notes (Signed)
Called pharmacy to verify meds.

## 2017-07-29 NOTE — H&P (Signed)
[address]            [place] [address]          [phone]       Lizzie Giarratano [place] Medical Record #[id number] Date of Birth: 12/11/1950  No ref. provider found System, Pcp Not In  [facility]  Chief Complaint:    Chief Complaint  Patient presents with  . Shortness of Breath  Patient examined, chest x-ray and CT scan images personally reviewed and counseled with patient  History of Present Illness:     66 year old Caucasian male reformed smoker presented to the ED with right chest pain associated with shortness of breath of sudden onset early this morning after waking up. 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 He was found have a 40% right spontaneous pneumothorax in the emergency department. He denied any trauma. No prior history of spontaneous pneumothorax. The pyatient also has a large bleb in the apex of the right lung which was not present and and x-ray from 2016.  Because of the proximity of the large bleb to the pneumothorax a pigtail catheter was placed by IR under CT guidance. Patient is currently being admitted for chest tube management of his spontaneous pneumothorax. He is currently resting comfortably on room air.   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 Alcoholism in recovery ([place])   . Arthritis    shoulders  . Frequency of urination   . Hyperlipidemia 12/06/2014  . Prostate cancer [facility]) urologist--  dr wrenn/  oncologist-- dr Tammi Klippel   Stage T1c, Gleason 3+4, PSA 7.4  . Wears glasses     Past Surgical History:  Procedure Laterality Date  . CYSTOSCOPY N/A 06/02/2015   Procedure: CYSTOSCOPY FLEXIBLE;  Surgeon: Irine Seal, MD;  Location: [facility];  Service: Urology;  Laterality: N/A;  1 SEEDS FOUND IN BLADDER and removed  . INGUINAL HERNIA REPAIR Left 10/18/2014    Procedure: OPEN LEFT INGUINAL HERNIA REPAIR WITH MESH;  Surgeon: [name], MD;  Location: [place];  Service: General;  Laterality: Left;  . INSERTION OF MESH Left 10/18/2014   Procedure: INSERTION OF MESH;  Surgeon: [name], MD;  Location: [place];  Service: General;  Laterality: Left;  . PROSTATE BIOPSY    . RADIOACTIVE SEED IMPLANT N/A 06/02/2015   Procedure: RADIOACTIVE SEED IMPLANT/BRACHYTHERAPY IMPLANT;  Surgeon: Irine Seal, MD;  Location: [facility];  Service: Urology;  Laterality: N/A;   12 SEEDS IMPLANTED    Social History   Tobacco Use  Smoking Status Former Smoker  . Packs/day: 1.00  . Years: 40.00  . Pack years: 40.00  . Types: Cigarettes  . Last attempt to quit: 08/20/2007  . Years since quitting: 9.9  Smokeless Tobacco Current User  . Types: Snuff    Social History   Substance and Sexual Activity  Alcohol Use No  . Alcohol/week: 0.0 oz   Comment: RECOVERING ALCOHOLIC -- Dennie Maizes    [id number]    Social History   Socioeconomic History  . Marital status: Divorced    Spouse name: Not on file  . Number of children: Not on file  . Years of education: Not on file  . Highest education level: Not on file  Social Needs  . Emergency planning/management officer  strain: Not on file  . Food insecurity - worry: Not on file  . Food insecurity - inability: Not on file  . Transportation needs - medical: Not on file  . Transportation needs - non-medical: Not on file  Occupational History  . Not on file  Tobacco Use  . Smoking status: Former Smoker    Packs/day: 1.00    Years: 40.00    Pack years: 40.00    Types: Cigarettes    Last attempt to quit: 08/20/2007    Years since quitting: 9.9  . Smokeless tobacco: Current User    Types: Snuff  Substance and Sexual Activity  . Alcohol use: No    Alcohol/week: 0.0 oz    Comment: RECOVERING ALCOHOLIC -- QUIT    [id number]  . Drug use: No  . Sexual activity: Not on file  Other Topics Concern  . Not on file  Social  History Narrative  . Not on file    No Known Allergies  Current Facility-Administered Medications  Medication Dose Route Frequency Provider Last Rate Last Dose  . 0.9 %  sodium chloride infusion  250 mL Intravenous PRN Prescott Gum, Collier Salina, MD      . aspirin EC tablet 81 mg  81 mg Oral Daily Prescott Gum, Collier Salina, MD      . docusate sodium (COLACE) capsule 100 mg  100 mg Oral BID Prescott Gum, Collier Salina, MD      . enoxaparin (LOVENOX) injection 40 mg  40 mg Subcutaneous Q24H Prescott Gum, Collier Salina, MD      . fentaNYL (SUBLIMAZE) 100 MCG/2ML injection           . ketorolac (TORADOL) 15 MG/ML injection 15 mg  15 mg Intravenous Q6H PRN Prescott Gum, Collier Salina, MD      . levalbuterol [facility] HFA) inhaler 2 puff  2 puff Inhalation Q6H Prescott Gum, Collier Salina, MD      . midazolam (VERSED) 2 MG/2ML injection           . oxyCODONE (Oxy IR/ROXICODONE) immediate release tablet 5 mg  5 mg Oral Q4H PRN Prescott Gum, Collier Salina, MD      . sodium chloride flush (NS) 0.9 % injection 3 mL  3 mL Intravenous Q12H Prescott Gum, Collier Salina, MD      . sodium chloride flush (NS) 0.9 % injection 3 mL  3 mL Intravenous PRN Prescott Gum, Collier Salina, MD      . sorbitol 70 % solution 30 mL  30 mL Oral Daily PRN Ivin Poot, MD       Current Outpatient Medications  Medication Sig Dispense Refill  . acetaminophen (TYLENOL) 500 MG tablet Take 500 mg by mouth daily as needed for headache.    . chlorpheniramine (CHLOR-TRIMETON) 4 MG tablet Take 4 mg by mouth every 6 (six) hours as needed for allergies.    . clindamycin (CLEOCIN) 150 MG capsule Take 2 capsules (300 mg total) by mouth 3 (three) times daily. May dispense as 150mg  capsules (Patient not taking: Reported on 09/15/2015) 60 capsule 0  . HYDROcodone-acetaminophen (NORCO) 5-325 MG tablet Take 1 tablet by mouth every 6 (six) hours as needed for moderate pain. (Patient not taking: Reported on 06/23/2015) 15 tablet 0  . simvastatin (ZOCOR) 20 MG tablet Take 1 tablet (20 mg total) by mouth at bedtime. (Patient not taking:  Reported on 06/23/2015) 90 tablet 1  . tamsulosin (FLOMAX) 0.4 MG CAPS capsule Take 1 capsule (0.4 mg total) by mouth daily. (Patient not taking: Reported on 07/29/2017) 30  capsule 1     Family History  Problem Relation Age of Onset  . Heart disease Mother   . Colon cancer Neg Hx   . Rectal cancer Neg Hx   . Stomach cancer Neg Hx   . Esophageal cancer Neg Hx      Review of Systems:     Cardiac Review of Systems: Y or N  Chest Pain [ y   ]  Resting SOB [ n  ] Exertional SOB  [  y]  Orthopnea Blue.Reese  ]   Pedal Edema [  n ]    Palpitations [n  ] Syncope  [n  ]   Presyncope [  n ]  General Review of Systems: [Y] = yes [  ]=no Constitional: recent weight change [  ]; anorexia [  ]; fatigue [  ]; nausea [  ]; night sweats [  ]; fever [  ]; or chills [  ];                                                                                                                                          Dental: poor dentition[  ]; Last Dentist visit: One year  Eye : blurred vision [  ]; diplopia [   ]; vision changes [  ];  Amaurosis fugax[  ]; Resp: cough [ y ];  wheezing[  ];  hemoptysis[  ]; shortness of breath[  ]; paroxysmal nocturnal dyspnea[  ]; dyspnea on exertion[  ]; or orthopnea[  ];  GI:  gallstones[  ], vomiting[  ];  dysphagia[  ]; melena[  ];  hematochezia [  ]; heartburn[  ];   Hx of  Colonoscopy[  ]; GU: kidney stones [  ]; hematuria[  ];   dysuria [  ];  nocturia[  ];  history of     obstruction [  ];                 Skin: rash, swelling[  ];, hair loss[  ];  peripheral edema[  ];  or itching[  ]; Musculosketetal: myalgias[  ];  joint swelling[  ];  joint erythema[  ];  joint pain[  ];  back pain[  ];  Heme/Lymph: bruising[  ];  bleeding[  ];  anemia[  ];  Neuro: TIA[  ];  headaches[  ];  stroke[  ];  vertigo[  ];  seizures[  ];   paresthesias[  ];  difficulty walking[  ];  Psych:depression[  ]; anxiety[  ];  Endocrine: diabetes[  ];  thyroid dysfunction[  ];  Immunizations: Flu [  ];  Pneumococcal[  ];  Other: History of prostate cancer status post seed implantation 2 years ago  Physical Exam: BP 119/78   Pulse (!) 54   Temp 98.3 F (36.8 C) (Oral)   Resp 12   SpO2 98%  Exam    General- alert and comfortable   Neck- no adenopathy JVD or bruit   Lungs- clear without rales, wheezes. Right chest tube in place connected to Pleur-evac with small air leak with cough   Cor- regular rate and rhythm, no murmur , gallop   Abdomen- soft, non-tender   Extremities - warm, non-tender, minimal edema   Neuro- oriented, appropriate, no focal weakness    Diagnostic Studies & Laboratory data:     Recent Radiology Findings:   Dg Chest 2 View  Result Date: 07/29/2017 CLINICAL DATA:  Shortness of Breath EXAM: CHEST  2 VIEW COMPARISON:  April 15, 2015 chest radiograph and chest CT September 02, 2014 FINDINGS: There is an apparent pneumothorax involving a portion of the right upper hemithorax. No tension component. There is atelectatic change in the right upper lobe as well as atelectatic change in the right base. The left lung is clear. Heart size and pulmonary vascular normal. No adenopathy. No bone lesions. There is atherosclerotic calcification in the left carotid artery. IMPRESSION: Pneumothorax involving a portion of the right upper hemithorax. The pneumothorax does not extend entirely along the lateral right hemithorax, suggesting that there may be scar tissue in the right mid lung region preventing pneumothorax from becoming larger. Suspect ruptured bulla causing this change from prior study. Areas of atelectatic change on the right. Left lung clear. Heart size normal. There is calcification in the left carotid artery. Comment: Considering potential chest tube placement, it may be prudent to consider noncontrast chest CT to assess for degree of bullous disease superimposed in the right upper lobe. Critical Value/emergent results were called by telephone at the time of  interpretation on 07/29/2017 at 10:27 am to Dr. Gareth Morgan, who verbally acknowledged these results. Electronically Signed   By: Lowella Grip III M.D.   On: 07/29/2017 10:27   Ct Chest Wo Contrast  Result Date: 07/29/2017 CLINICAL DATA:  Chest pain, shortness of Breath EXAM: CT CHEST WITHOUT CONTRAST TECHNIQUE: Multidetector CT imaging of the chest was performed following the standard protocol without IV contrast. COMPARISON:  Chest x-ray 07/29/2017 FINDINGS: Cardiovascular: Moderate coronary artery calcifications, most pronounced in the left anterior descending coronary artery. Scattered aortic calcifications. No aneurysm. Heart is normal size. Mediastinum/Nodes: No mediastinal, hilar, or axillary adenopathy. Trachea and esophagus are unremarkable. Thyroid unremarkable. Lungs/Pleura: Large pneumothorax involving the left lung as seen on prior chest x-ray, 40-50% in size. Atelectasis in portions of the right upper lobe. There appears to be a large air-filled 11 in the right upper lobe. Mild centrilobular emphysema. No effusions. Upper Abdomen: Imaging into the upper abdomen shows no acute findings. Musculoskeletal: Chest wall soft tissues are unremarkable. No acute bony abnormality. IMPRESSION: Large right side pneumothorax, 40-50%. Areas of atelectasis in a right upper lobe. Large bleb noted in the right upper lobe/ apex. Coronary artery disease. Aortic Atherosclerosis (ICD10-I70.0) and Emphysema (ICD10-J43.9). Electronically Signed   By: Rolm Baptise M.D.   On: 07/29/2017 12:50   Dg Chest Port 1 View  Result Date: 07/29/2017 CLINICAL DATA:  Right-sided chest tube/pneumothorax. EXAM: PORTABLE CHEST 1 VIEW COMPARISON:  07/29/2017 FINDINGS: Interval placement of small caliber pigtail right-sided chest tube which appears in adequate position. There has been interval re-expansion of patient's right pneumothorax with small residual right apical pneumothorax remaining. Possible loculated component of  pneumothorax in the right costophrenic angle. Linear scarring over the right mid to upper lung. Left lung is clear. Cardiomediastinal silhouette and remainder the exam is unchanged. IMPRESSION: Moderate interval  re-expansion of patient's right-sided pneumothorax post small caliber pigtail chest tube placement. Small residual left apical pneumothorax. Linear atelectasis/ scarring left mid to upper lung. Electronically Signed   By: Marin Olp M.D.   On: 07/29/2017 17:47   Ct Image Guided Drainage By Percutaneous Catheter  Result Date: 07/29/2017 INDICATION: RIGHT SPONTANEOUS PNEUMOTHORAX EXAM: CT GUIDED 10 FRENCH RIGHT CHEST TUBE INSERTION MEDICATIONS: 1% LIDOCAINE LOCAL ANESTHESIA/SEDATION: Fentanyl 100 mcg IV; Versed 2.0 mg IV Moderate Sedation Time:  11 MINUTES The patient was continuously monitored during the procedure by the interventional radiology nurse under my direct supervision. COMPLICATIONS: None immediate. PROCEDURE: Informed written consent was obtained from the patient after a thorough discussion of the procedural risks, benefits and alternatives. All questions were addressed. Maximal Sterile Barrier Technique was utilized including caps, mask, sterile gowns, sterile gloves, sterile drape, hand hygiene and skin antiseptic. A timeout was performed prior to the initiation of the procedure. Previous imaging reviewed. Patient positioned right anterior oblique. Noncontrast localization CT performed. The large anterior component of the right pneumothorax was localized. Overlying skin marked in the mid axillary line. Under sterile conditions and local anesthesia, an 18 gauge introducer needle was advanced percutaneously into the right chest in the mid axillary line through an intercostal space. Needle position confirmed with CT. Guidewire inserted followed by tract dilatation to insert a 10 [place] drain. Drain catheter position confirmed with CT. Catheter secured with Prolene suture and connected to  external Pleur-Evac. Vaseline guaze sterile dressing applied at the site. Patient tolerated the procedure well. No immediate complication. IMPRESSION: Successful CT-guided 10 French right chest tube insertion for spontaneous pneumothorax. Electronically Signed   By: Jerilynn Mages.  Shick M.D.   On: 07/29/2017 16:00      Recent Lab Findings: Lab Results  Component Value Date   WBC 8.9 07/29/2017   HGB 16.0 07/29/2017   HCT 45.7 07/29/2017   PLT 329 07/29/2017   GLUCOSE 97 07/29/2017   CHOL 178 12/06/2014   TRIG 82.0 12/06/2014   HDL 50.10 12/06/2014   LDLCALC 112 (H) 12/06/2014   ALT 13 (L) 07/29/2017   AST 14 (L) 07/29/2017   NA 136 07/29/2017   K 4.3 07/29/2017   CL 104 07/29/2017   CREATININE 0.78 07/29/2017   BUN 10 07/29/2017   CO2 25 07/29/2017   INR 1.00 07/29/2017      Assessment / Plan:     Spontaneous right pneumothorax    COPD with bullous emphysema  Right chest tube placed by IR under CT guidance Chest tube to 20 cm of suction Follow-up chest x-ray in a.m.

## 2017-07-29 NOTE — ED Notes (Signed)
Pt is transfer from [facility] s/p right chest tube insertion. Pt to see cardiothoracic surgery. VSS. E&TK2

## 2017-07-29 NOTE — ED Notes (Signed)
Bed control notified and Dr Melvenia Needles notified that new Admit order needs to be placed for step down.

## 2017-07-29 NOTE — Procedures (Signed)
Spontaneous Rt ptx  S/p CT RT CHEST TUBE 10FR  No comp Stable Full report in pacs

## 2017-07-29 NOTE — ED Provider Notes (Signed)
Patient seen on arrival. Had spontaneous right pneumothorax treated with CT-guided chest tube due to large adjacent bleb. Arrives here with the complaint of some discomfort at chest tube insertion site. No shortness of breath. Clear bilateral breath sounds. Portable chest x-ray ordered. CT surgery team here initiating admission orders on patient.   Tanna Furry, MD 07/29/17 [phone]

## 2017-07-29 NOTE — ED Notes (Signed)
Pt in procedure

## 2017-07-29 NOTE — ED Notes (Signed)
Received from IR. Received report from Ignacio, [place]

## 2017-07-29 NOTE — ED Notes (Signed)
Pt reports SOB and CP for the past week. CP has resolved, however SOB continues. Tried to go to PCP and cardiologist, but they were not open.

## 2017-07-29 NOTE — ED Provider Notes (Signed)
[facility]ERGENCY DEPARTMENT Provider Note   CSN: [id number] Arrival date & time: 07/29/17  2706     History   Chief Complaint Chief Complaint  Patient presents with  . Shortness of Breath    HPI Cory Willis is a 66 y.o. male.  The history is provided by the patient. No language interpreter was used.  Shortness of Breath     Cory Willis is a 66 y.o. male who presents to the Emergency Department complaining of sob.  He reports chest pain and sob starting one week ago.  Pain is described as sharp and constant in nature and located in the right upper chest.  Pain overall is improving since it began.  He does have persistent sob and doe. He has mild cough for the last two days, nonproductive.  No fevers, nausea, vomiting, abdominal pain, leg swelling or pain.  He is a former smoker (8 years ago), recovered alcoholic.  No known hx/o lung disease.  No prior similar sxs.    Past Medical History:  Diagnosis Date  . Alcoholism in recovery ([place])   . Arthritis    shoulders  . Frequency of urination   . Hyperlipidemia 12/06/2014  . Prostate cancer [facility]) urologist--  dr wrenn/  oncologist-- dr Tammi Klippel   Stage T1c, Gleason 3+4, PSA 7.4  . Wears glasses     Patient Active Problem List   Diagnosis Date Noted  . Pneumothorax 07/29/2017  . Malignant neoplasm of prostate ([place]) 01/27/2015  . Increased prostate specific antigen (PSA) velocity 12/06/2014  . Hyperlipidemia 12/06/2014  . Wellness examination 08/26/2014  . Cataract 08/19/2014  . History of alcoholism ([place]) 08/19/2014  . Inguinal hernia 08/19/2014    Past Surgical History:  Procedure Laterality Date  . CYSTOSCOPY N/A 06/02/2015   Procedure: CYSTOSCOPY FLEXIBLE;  Surgeon: Irine Seal, MD;  Location: [facility];  Service: Urology;  Laterality: N/A;  1 SEEDS FOUND IN BLADDER and removed  . INGUINAL HERNIA REPAIR Left 10/18/2014   Procedure: OPEN LEFT INGUINAL HERNIA REPAIR WITH MESH;   Surgeon: [name], MD;  Location: [place];  Service: General;  Laterality: Left;  . INSERTION OF MESH Left 10/18/2014   Procedure: INSERTION OF MESH;  Surgeon: [name], MD;  Location: [place];  Service: General;  Laterality: Left;  . PROSTATE BIOPSY    . RADIOACTIVE SEED IMPLANT N/A 06/02/2015   Procedure: RADIOACTIVE SEED IMPLANT/BRACHYTHERAPY IMPLANT;  Surgeon: Irine Seal, MD;  Location: [facility];  Service: Urology;  Laterality: N/A;   [place] Medications    Prior to Admission medications   Medication Sig Start Date End Date Taking? Authorizing Provider  acetaminophen (TYLENOL) 500 MG tablet Take 500 mg by mouth daily as needed for headache.   Yes [provider]  chlorpheniramine (CHLOR-TRIMETON) 4 MG tablet Take 4 mg by mouth every 6 (six) hours as needed for allergies.   Yes [provider]  clindamycin (CLEOCIN) 150 MG capsule Take 2 capsules (300 mg total) by mouth 3 (three) times daily. May dispense as 150mg  capsules Patient not taking: Reported on 09/15/2015 09/15/15   Nona Dell, PA-C  HYDROcodone-acetaminophen [facility]) 5-325 MG tablet Take 1 tablet by mouth every 6 (six) hours as needed for moderate pain. Patient not taking: Reported on 06/23/2015 06/02/15   Irine Seal, MD  simvastatin (ZOCOR) 20 MG tablet Take 1 tablet (20 mg total) by mouth at bedtime. Patient not taking: Reported  on 06/23/2015 12/06/14   Saguier, Percell Miller, PA-C  tamsulosin (FLOMAX) 0.4 MG CAPS capsule Take 1 capsule (0.4 mg total) by mouth daily. Patient not taking: Reported on 07/29/2017 06/02/15   Irine Seal, MD    Family History Family History  Problem Relation Age of Onset  . Heart disease Mother   . Colon cancer Neg Hx   . Rectal cancer Neg Hx   . Stomach cancer Neg Hx   . Esophageal cancer Neg Hx     Social History Social History   Tobacco Use  . Smoking status: Former Smoker    Packs/day: 1.00    Years: 40.00     Pack years: 40.00    Types: Cigarettes    Last attempt to quit: 08/20/2007    Years since quitting: 9.9  . Smokeless tobacco: Current User    Types: Snuff  Substance Use Topics  . Alcohol use: No    Alcohol/week: 0.0 oz    Comment: RECOVERING ALCOHOLIC -- QUIT    [id number]  . Drug use: No     Allergies   Patient has no known allergies.   Review of Systems Review of Systems  Respiratory: Positive for shortness of breath.   All other systems reviewed and are negative.    Physical Exam Updated Vital Signs BP (!) 120/91   Pulse (!) 58   Temp 98.3 F (36.8 C) (Oral)   Resp 15   SpO2 96%   Physical Exam  Constitutional: He is oriented to person, place, and time. He appears well-developed and well-nourished.  HENT:  Head: Normocephalic and atraumatic.  Cardiovascular: Normal rate and regular rhythm.  No murmur heard. Pulmonary/Chest: Effort normal. No respiratory distress.  Decreased air movement in right lung fields.    Abdominal: Soft. There is no tenderness. There is no rebound and no guarding.  Musculoskeletal: He exhibits no edema or tenderness.  Neurological: He is alert and oriented to person, place, and time.  Skin: Skin is warm and dry.  Psychiatric: He has a normal mood and affect. His behavior is normal.  Nursing note and vitals reviewed.    ED Treatments / Results  Labs (all labs ordered are listed, but only abnormal results are displayed) Labs Reviewed  COMPREHENSIVE METABOLIC PANEL - Abnormal; Notable for the following components:      Result Value   Calcium 10.8 (*)    AST 14 (*)    ALT 13 (*)    All other components within normal limits  CBC WITH DIFFERENTIAL/PLATELET  BRAIN NATRIURETIC PEPTIDE  PROTIME-INR  CBC  CREATININE, SERUM  BASIC METABOLIC PANEL  CBC  I-STAT TROPONIN, ED    EKG  EKG Interpretation None       Radiology Dg Chest 2 View  Result Date: 07/29/2017 CLINICAL DATA:  Shortness of Breath EXAM: CHEST  2 VIEW  COMPARISON:  April 15, 2015 chest radiograph and chest CT September 02, 2014 FINDINGS: There is an apparent pneumothorax involving a portion of the right upper hemithorax. No tension component. There is atelectatic change in the right upper lobe as well as atelectatic change in the right base. The left lung is clear. Heart size and pulmonary vascular normal. No adenopathy. No bone lesions. There is atherosclerotic calcification in the left carotid artery. IMPRESSION: Pneumothorax involving a portion of the right upper hemithorax. The pneumothorax does not extend entirely along the lateral right hemithorax, suggesting that there may be scar tissue in the right mid lung region preventing pneumothorax from becoming larger. Suspect  ruptured bulla causing this change from prior study. Areas of atelectatic change on the right. Left lung clear. Heart size normal. There is calcification in the left carotid artery. Comment: Considering potential chest tube placement, it may be prudent to consider noncontrast chest CT to assess for degree of bullous disease superimposed in the right upper lobe. Critical Value/emergent results were called by telephone at the time of interpretation on 07/29/2017 at 10:27 am to Dr. Gareth Morgan, who verbally acknowledged these results. Electronically Signed   By: Lowella Grip III M.D.   On: 07/29/2017 10:27   Ct Chest Wo Contrast  Result Date: 07/29/2017 CLINICAL DATA:  Chest pain, shortness of Breath EXAM: CT CHEST WITHOUT CONTRAST TECHNIQUE: Multidetector CT imaging of the chest was performed following the standard protocol without IV contrast. COMPARISON:  Chest x-ray 07/29/2017 FINDINGS: Cardiovascular: Moderate coronary artery calcifications, most pronounced in the left anterior descending coronary artery. Scattered aortic calcifications. No aneurysm. Heart is normal size. Mediastinum/Nodes: No mediastinal, hilar, or axillary adenopathy. Trachea and esophagus are unremarkable.  Thyroid unremarkable. Lungs/Pleura: Large pneumothorax involving the left lung as seen on prior chest x-ray, 40-50% in size. Atelectasis in portions of the right upper lobe. There appears to be a large air-filled 11 in the right upper lobe. Mild centrilobular emphysema. No effusions. Upper Abdomen: Imaging into the upper abdomen shows no acute findings. Musculoskeletal: Chest wall soft tissues are unremarkable. No acute bony abnormality. IMPRESSION: Large right side pneumothorax, 40-50%. Areas of atelectasis in a right upper lobe. Large bleb noted in the right upper lobe/ apex. Coronary artery disease. Aortic Atherosclerosis (ICD10-I70.0) and Emphysema (ICD10-J43.9). Electronically Signed   By: Rolm Baptise M.D.   On: 07/29/2017 12:50   Dg Chest Port 1 View  Result Date: 07/29/2017 CLINICAL DATA:  Right-sided chest tube/pneumothorax. EXAM: PORTABLE CHEST 1 VIEW COMPARISON:  07/29/2017 FINDINGS: Interval placement of small caliber pigtail right-sided chest tube which appears in adequate position. There has been interval re-expansion of patient's right pneumothorax with small residual right apical pneumothorax remaining. Possible loculated component of pneumothorax in the right costophrenic angle. Linear scarring over the right mid to upper lung. Left lung is clear. Cardiomediastinal silhouette and remainder the exam is unchanged. IMPRESSION: Moderate interval re-expansion of patient's right-sided pneumothorax post small caliber pigtail chest tube placement. Small residual left apical pneumothorax. Linear atelectasis/ scarring left mid to upper lung. Electronically Signed   By: Marin Olp M.D.   On: 07/29/2017 17:47   Ct Image Guided Drainage By Percutaneous Catheter  Result Date: 07/29/2017 INDICATION: RIGHT SPONTANEOUS PNEUMOTHORAX EXAM: CT GUIDED 10 FRENCH RIGHT CHEST TUBE INSERTION MEDICATIONS: 1% LIDOCAINE LOCAL ANESTHESIA/SEDATION: Fentanyl 100 mcg IV; Versed 2.0 mg IV Moderate Sedation Time:  11  MINUTES The patient was continuously monitored during the procedure by the interventional radiology nurse under my direct supervision. COMPLICATIONS: None immediate. PROCEDURE: Informed written consent was obtained from the patient after a thorough discussion of the procedural risks, benefits and alternatives. All questions were addressed. Maximal Sterile Barrier Technique was utilized including caps, mask, sterile gowns, sterile gloves, sterile drape, hand hygiene and skin antiseptic. A timeout was performed prior to the initiation of the procedure. Previous imaging reviewed. Patient positioned right anterior oblique. Noncontrast localization CT performed. The large anterior component of the right pneumothorax was localized. Overlying skin marked in the mid axillary line. Under sterile conditions and local anesthesia, an 18 gauge introducer needle was advanced percutaneously into the right chest in the mid axillary line through an intercostal space. Needle position  confirmed with CT. Guidewire inserted followed by tract dilatation to insert a 10 [place] drain. Drain catheter position confirmed with CT. Catheter secured with Prolene suture and connected to external Pleur-Evac. Vaseline guaze sterile dressing applied at the site. Patient tolerated the procedure well. No immediate complication. IMPRESSION: Successful CT-guided 10 French right chest tube insertion for spontaneous pneumothorax. Electronically Signed   By: Jerilynn Mages.  Shick M.D.   On: 07/29/2017 16:00    Procedures Procedures (including critical care time)  Medications Ordered in ED Medications  midazolam (VERSED) 2 MG/2ML injection (not administered)  fentaNYL (SUBLIMAZE) 100 MCG/2ML injection (not administered)  enoxaparin (LOVENOX) injection 40 mg (not administered)  sodium chloride flush (NS) 0.9 % injection 3 mL (not administered)  sodium chloride flush (NS) 0.9 % injection 3 mL (not administered)  0.9 %  sodium chloride infusion (not  administered)  oxyCODONE (Oxy IR/ROXICODONE) immediate release tablet 5 mg (not administered)  ketorolac (TORADOL) 15 MG/ML injection 15 mg (not administered)  docusate sodium (COLACE) capsule 100 mg (not administered)  sorbitol 70 % solution 30 mL (not administered)  aspirin EC tablet 81 mg (not administered)  levalbuterol (XOPENEX HFA) inhaler 2 puff (not administered)  lidocaine (PF) (XYLOCAINE) 1 % injection (20 mLs  Given 07/29/17 1520)  midazolam (VERSED) injection (1 mg Intravenous Given 07/29/17 1520)  fentaNYL (SUBLIMAZE) injection (50 mcg Intravenous Given 07/29/17 1523)     Initial Impression / Assessment and Plan / ED Course  I have reviewed the triage vital signs and the nursing notes.  Pertinent labs & imaging results that were available during my care of the patient were reviewed by me and considered in my medical decision making (see chart for details).    Pt with large right sided PTX, stable in ED.  D/w Dr. Prescott Gum with CT surgery - recommends transfer to [facility] ED for pigtail catheter.  D/w Dr. Lita Mains who accepts the patient in transfer.    Final Clinical Impressions(s) / ED Diagnoses   Final diagnoses:  Pneumothorax    ED Discharge Orders    None       Quintella Reichert, MD 07/29/17 1820

## 2017-07-29 NOTE — Consult Note (Signed)
Chief Complaint: Patient was seen in consultation today for CT-guided right chest tube placement Chief Complaint  Patient presents with  . Shortness of Breath    Referring Physician(s): Vernell Leep  Supervising Physician: Daryll Brod  Patient Status: [facility] - ED  History of Present Illness: Cory Willis is a 66 y.o. male, ex-smoker, with history of prostate CA and radioactive seed implants, hyperlipidemia who presented to [facility] ED today with dyspnea and history of recent chest pain of about 1 week duration.  Subsequent imaging revealed a large right-sided pneumothorax approximately 40-50% with large bleb noted in the right upper lobe/apex.  Also with imaging evidence of COPD and coronary artery disease.  Request received from thoracic surgery for right chest tube placement.  Past Medical History:  Diagnosis Date  . Alcoholism in recovery ([place])   . Arthritis    shoulders  . Frequency of urination   . Hyperlipidemia 12/06/2014  . Prostate cancer [facility]) urologist--  dr wrenn/  oncologist-- dr Tammi Klippel   Stage T1c, Gleason 3+4, PSA 7.4  . Wears glasses     Past Surgical History:  Procedure Laterality Date  . CYSTOSCOPY N/A 06/02/2015   Procedure: CYSTOSCOPY FLEXIBLE;  Surgeon: Irine Seal, MD;  Location: [facility];  Service: Urology;  Laterality: N/A;  1 SEEDS FOUND IN BLADDER and removed  . INGUINAL HERNIA REPAIR Left 10/18/2014   Procedure: OPEN LEFT INGUINAL HERNIA REPAIR WITH MESH;  Surgeon: [name], MD;  Location: [place];  Service: General;  Laterality: Left;  . INSERTION OF MESH Left 10/18/2014   Procedure: INSERTION OF MESH;  Surgeon: [name], MD;  Location: [place];  Service: General;  Laterality: Left;  . PROSTATE BIOPSY    . RADIOACTIVE SEED IMPLANT N/A 06/02/2015   Procedure: RADIOACTIVE SEED IMPLANT/BRACHYTHERAPY IMPLANT;  Surgeon: Irine Seal, MD;  Location: [facility];  Service: Urology;  Laterality: N/A;   70 SEEDS  IMPLANTED    Allergies: Patient has no known allergies.  Medications: Prior to Admission medications   Medication Sig Start Date End Date Taking? Authorizing Provider  acetaminophen (TYLENOL) 500 MG tablet Take 500 mg by mouth daily as needed for headache.   Yes [provider]  chlorpheniramine (CHLOR-TRIMETON) 4 MG tablet Take 4 mg by mouth every 6 (six) hours as needed for allergies.   Yes [provider]  clindamycin (CLEOCIN) 150 MG capsule Take 2 capsules (300 mg total) by mouth 3 (three) times daily. May dispense as 150mg  capsules Patient not taking: Reported on 09/15/2015 09/15/15   Nona Dell, PA-C  HYDROcodone-acetaminophen [facility]) 5-325 MG tablet Take 1 tablet by mouth every 6 (six) hours as needed for moderate pain. Patient not taking: Reported on 06/23/2015 06/02/15   Irine Seal, MD  simvastatin (ZOCOR) 20 MG tablet Take 1 tablet (20 mg total) by mouth at bedtime. Patient not taking: Reported on 06/23/2015 12/06/14   Saguier, Percell Miller, PA-C  tamsulosin (FLOMAX) 0.4 MG CAPS capsule Take 1 capsule (0.4 mg total) by mouth daily. Patient not taking: Reported on 07/29/2017 06/02/15   Irine Seal, MD     Family History  Problem Relation Age of Onset  . Heart disease Mother   . Colon cancer Neg Hx   . Rectal cancer Neg Hx   . Stomach cancer Neg Hx   . Esophageal cancer Neg Hx     Social History   Socioeconomic History  . Marital status: Divorced    Spouse name: None  . Number of  children: None  . Years of education: None  . Highest education level: None  Social Needs  . Financial resource strain: None  . Food insecurity - worry: None  . Food insecurity - inability: None  . Transportation needs - medical: None  . Transportation needs - non-medical: None  Occupational History  . None  Tobacco Use  . Smoking status: Former Smoker    Packs/day: 1.00    Years: 40.00    Pack years: 40.00    Types: Cigarettes    Last attempt to quit:  08/20/2007    Years since quitting: 9.9  . Smokeless tobacco: Current User    Types: Snuff  Substance and Sexual Activity  . Alcohol use: No    Alcohol/week: 0.0 oz    Comment: RECOVERING ALCOHOLIC -- QUIT    [id number]  . Drug use: No  . Sexual activity: None  Other Topics Concern  . None  Social History Narrative  . None      Review of Systems denies fever, headache, abdominal pain, nausea, vomiting or bleeding.  He does have some right-sided chest discomfort with radiation to the flank.  Vital Signs: BP 116/83   Pulse 62   Temp 97.9 F (36.6 C) (Oral)   Resp 20   SpO2 92%   Physical Exam awake, alert.  Chest with diminished breath sounds right apex, left clear; heart with regular rate and rhythm.  Abdomen soft, positive bowel sounds, nontender.  No lower extremity edema.  Imaging: Dg Chest 2 View  Result Date: 07/29/2017 CLINICAL DATA:  Shortness of Breath EXAM: CHEST  2 VIEW COMPARISON:  April 15, 2015 chest radiograph and chest CT September 02, 2014 FINDINGS: There is an apparent pneumothorax involving a portion of the right upper hemithorax. No tension component. There is atelectatic change in the right upper lobe as well as atelectatic change in the right base. The left lung is clear. Heart size and pulmonary vascular normal. No adenopathy. No bone lesions. There is atherosclerotic calcification in the left carotid artery. IMPRESSION: Pneumothorax involving a portion of the right upper hemithorax. The pneumothorax does not extend entirely along the lateral right hemithorax, suggesting that there may be scar tissue in the right mid lung region preventing pneumothorax from becoming larger. Suspect ruptured bulla causing this change from prior study. Areas of atelectatic change on the right. Left lung clear. Heart size normal. There is calcification in the left carotid artery. Comment: Considering potential chest tube placement, it may be prudent to consider noncontrast chest CT  to assess for degree of bullous disease superimposed in the right upper lobe. Critical Value/emergent results were called by telephone at the time of interpretation on 07/29/2017 at 10:27 am to Dr. Gareth Morgan, who verbally acknowledged these results. Electronically Signed   By: Lowella Grip III M.D.   On: 07/29/2017 10:27   Ct Chest Wo Contrast  Result Date: 07/29/2017 CLINICAL DATA:  Chest pain, shortness of Breath EXAM: CT CHEST WITHOUT CONTRAST TECHNIQUE: Multidetector CT imaging of the chest was performed following the standard protocol without IV contrast. COMPARISON:  Chest x-ray 07/29/2017 FINDINGS: Cardiovascular: Moderate coronary artery calcifications, most pronounced in the left anterior descending coronary artery. Scattered aortic calcifications. No aneurysm. Heart is normal size. Mediastinum/Nodes: No mediastinal, hilar, or axillary adenopathy. Trachea and esophagus are unremarkable. Thyroid unremarkable. Lungs/Pleura: Large pneumothorax involving the left lung as seen on prior chest x-ray, 40-50% in size. Atelectasis in portions of the right upper lobe. There appears to be a large  air-filled 11 in the right upper lobe. Mild centrilobular emphysema. No effusions. Upper Abdomen: Imaging into the upper abdomen shows no acute findings. Musculoskeletal: Chest wall soft tissues are unremarkable. No acute bony abnormality. IMPRESSION: Large right side pneumothorax, 40-50%. Areas of atelectasis in a right upper lobe. Large bleb noted in the right upper lobe/ apex. Coronary artery disease. Aortic Atherosclerosis (ICD10-I70.0) and Emphysema (ICD10-J43.9). Electronically Signed   By: Rolm Baptise M.D.   On: 07/29/2017 12:50    Labs:  CBC: Recent Labs    07/29/17 1135  WBC 8.9  HGB 16.0  HCT 45.7  PLT 329    COAGS: No results for input(s): INR, APTT in the last 8760 hours.  BMP: Recent Labs    07/29/17 1135  NA 136  K 4.3  CL 104  CO2 25  GLUCOSE 97  BUN 10  CALCIUM 10.8*   CREATININE 0.78  GFRNONAA >60  GFRAA >60    LIVER FUNCTION TESTS: Recent Labs    07/29/17 1135  BILITOT 0.8  AST 14*  ALT 13*  ALKPHOS 63  PROT 7.4  ALBUMIN 4.3    TUMOR MARKERS: No results for input(s): AFPTM, CEA, CA199, CHROMGRNA in the last 8760 hours.  Assessment and Plan: 66 y.o. male, ex-smoker, with history of prostate CA and radioactive seed implants, hyperlipidemia who presented to [facility] ED today with dyspnea and history of recent chest pain of about 1 week duration.  Subsequent imaging revealed a large right-sided pneumothorax approximately 40-50% with large bleb noted in the right upper lobe/apex. Also with imaging evidence of COPD and coronary artery disease.  Request received from thoracic surgery for right chest tube placement.Risks and benefits discussed with the patient including bleeding, infection, damage to adjacent structures, need for prolonged drainage discussed with patient with his understanding and consent All of the patient's questions were answered, patient is agreeable to proceed.Consent signed and in chart. Procedure scheduled today ASAP.    Thank you for this interesting consult.  I greatly enjoyed meeting [facility] and look forward to participating in their care.  A copy of this report was sent to the requesting provider on this date.  Electronically Signed: D. Rowe Robert, PA-C 07/29/2017, 2:48 PM   I spent a total of 25 minutes  in face to face in clinical consultation, greater than 50% of which was counseling/coordinating care for right chest tube placement

## 2017-07-29 NOTE — ED Notes (Signed)
Paged admitting  

## 2017-07-30 ENCOUNTER — Inpatient Hospital Stay (HOSPITAL_COMMUNITY): Payer: Medicare HMO

## 2017-07-30 LAB — BASIC METABOLIC PANEL
Anion gap: 5 (ref 5–15)
BUN: 10 mg/dL (ref 6–20)
CO2: 28 mmol/L (ref 22–32)
Calcium: 10.7 mg/dL — ABNORMAL HIGH (ref 8.9–10.3)
Chloride: 103 mmol/L (ref 101–111)
Creatinine, Ser: 0.89 mg/dL (ref 0.61–1.24)
GFR calc Af Amer: >60 mL/min (ref >60)
GFR calc non Af Amer: >60 mL/min (ref >60)
Glucose, Bld: 89 mg/dL (ref 65–99)
Potassium: 4.3 mmol/L (ref 3.5–5.1)
Sodium: 136 mmol/L (ref 135–145)

## 2017-07-30 LAB — CBC
HCT: 44.8 % (ref 39.0–52.0)
Hemoglobin: 15.3 g/dL (ref 13.0–17.0)
MCH: 31 pg (ref 26.0–34.0)
MCHC: 34.2 g/dL (ref 30.0–36.0)
MCV: 90.9 fL (ref 78.0–100.0)
Platelets: 281 10*3/uL (ref 150–400)
RBC: 4.93 MIL/uL (ref 4.22–5.81)
RDW: 13.3 % (ref 11.5–15.5)
WBC: 8.5 10*3/uL (ref 4.0–10.5)

## 2017-07-30 LAB — MRSA PCR SCREENING: MRSA by PCR: NEGATIVE

## 2017-07-30 MED ORDER — LEVALBUTEROL HCL 0.63 MG/3ML IN NEBU: 0.6300 mg | INHALATION_SOLUTION | Freq: Four times a day (QID) | RESPIRATORY_TRACT | Status: DC | PRN

## 2017-07-30 NOTE — Plan of Care (Signed)
Patient with good oxygenation on room air, no distress noted. Chest tube functioning appropriately with dressing clean, dry, and intact.

## 2017-07-30 NOTE — Progress Notes (Signed)
  Subjective: R spont pntx - bullous emphysema Pain better at Pigtail site Still with air leak Objective: Vital signs in last 24 hours: Temp:  [98.3 F (36.8 C)-99.1 F (37.3 C)] 99.1 F (37.3 C) (12/24 2157) Pulse Rate:  [54-66] 61 (12/24 2157) Cardiac Rhythm: Heart block (12/25 0700) Resp:  [11-26] 16 (12/24 2157) BP: (106-144)/(64-91) 144/79 (12/24 2157) SpO2:  [20 %-98 %] 92 % (12/25 0851)  Hemodynamic parameters for last 24 hours:  stable  Intake/Output from previous day: No intake/output data recorded. Intake/Output this shift: No intake/output data recorded.       Exam    General- alert and comfortable   Lungs- clear without rales, wheezes   Cor- regular rate and rhythm, no murmur , gallop   Abdomen- soft, non-tender   Extremities - warm, non-tender, minimal edema   Neuro- oriented, appropriate, no focal weakness   Lab Results: Recent Labs    07/29/17 1954 07/30/17 0252  WBC 11.0* 8.5  HGB 15.8 15.3  HCT 45.1 44.8  PLT 255 281   BMET:  Recent Labs    07/29/17 1135 07/29/17 1954 07/30/17 0252  NA 136  --  136  K 4.3  --  4.3  CL 104  --  103  CO2 25  --  28  GLUCOSE 97  --  89  BUN 10  --  10  CREATININE 0.78 0.79 0.89  CALCIUM 10.8*  --  10.7*    PT/INR:  Recent Labs    07/29/17 1142  LABPROT 13.1  INR 1.00   ABG No results found for: PHART, HCO3, TCO2, ACIDBASEDEF, O2SAT CBG (last 3)  No results for input(s): GLUCAP in the last 72 hours.  Assessment/Plan: S/P  spont R pntx- good re-expansion with IR tube Hypercalcemia, prostate Ca cont suction to tube  LOS: 1 day    Tharon Aquas Trigt III 07/30/2017

## 2017-07-31 ENCOUNTER — Inpatient Hospital Stay (HOSPITAL_COMMUNITY): Payer: Medicare HMO

## 2017-07-31 NOTE — Progress Notes (Signed)
Referring Physician(s): Dr Nils Pyle  Supervising Physician: Arne Cleveland  Patient Status:  [facility] - In-pt  Chief Complaint:  Spontaneous PTX Rt chest tube placed 12/24  Subjective:  Chest tube in place Feels fine No complaints  CXR today: IMPRESSION: Increase in right pneumothorax with roughly 10-15% pneumothorax Present.  Now water sealed since 9 am today per Dr VT    Allergies: Patient has no known allergies.  Medications: Prior to Admission medications   Medication Sig Start Date End Date Taking? Authorizing Provider  acetaminophen (TYLENOL) 500 MG tablet Take 500 mg by mouth daily as needed for headache.   Yes [provider]  chlorpheniramine (CHLOR-TRIMETON) 4 MG tablet Take 4 mg by mouth every 6 (six) hours as needed for allergies.   Yes [provider]  clindamycin (CLEOCIN) 150 MG capsule Take 2 capsules (300 mg total) by mouth 3 (three) times daily. May dispense as 150mg  capsules Patient not taking: Reported on 09/15/2015 09/15/15   Nona Dell, PA-C  HYDROcodone-acetaminophen [facility]) 5-325 MG tablet Take 1 tablet by mouth every 6 (six) hours as needed for moderate pain. Patient not taking: Reported on 06/23/2015 06/02/15   Irine Seal, MD  simvastatin (ZOCOR) 20 MG tablet Take 1 tablet (20 mg total) by mouth at bedtime. Patient not taking: Reported on 06/23/2015 12/06/14   Saguier, Percell Miller, PA-C  tamsulosin (FLOMAX) 0.4 MG CAPS capsule Take 1 capsule (0.4 mg total) by mouth daily. Patient not taking: Reported on 07/29/2017 06/02/15   Irine Seal, MD     Vital Signs: BP 111/69 (BP Location: Left Arm)   Pulse 72   Temp 98.5 F (36.9 C) (Oral)   Resp (!) 23   Ht 5\' 8"  (1.727 m)   Wt 175 lb 14.8 oz (79.8 kg)   SpO2 90%   BMI 26.75 kg/m   Physical Exam  Pulmonary/Chest: Effort normal and breath sounds normal.  Abdominal: Soft.  Neurological: He is alert.  Skin: Skin is warm and dry.  Right chest tube in place Site  is clean and dry NT No bleeding + air leak- minimal  10-15 cc yellow fluid in Pleurvac   Nursing note and vitals reviewed.   Imaging: Dg Chest 2 View  Result Date: 07/29/2017 CLINICAL DATA:  Shortness of Breath EXAM: CHEST  2 VIEW COMPARISON:  April 15, 2015 chest radiograph and chest CT September 02, 2014 FINDINGS: There is an apparent pneumothorax involving a portion of the right upper hemithorax. No tension component. There is atelectatic change in the right upper lobe as well as atelectatic change in the right base. The left lung is clear. Heart size and pulmonary vascular normal. No adenopathy. No bone lesions. There is atherosclerotic calcification in the left carotid artery. IMPRESSION: Pneumothorax involving a portion of the right upper hemithorax. The pneumothorax does not extend entirely along the lateral right hemithorax, suggesting that there may be scar tissue in the right mid lung region preventing pneumothorax from becoming larger. Suspect ruptured bulla causing this change from prior study. Areas of atelectatic change on the right. Left lung clear. Heart size normal. There is calcification in the left carotid artery. Comment: Considering potential chest tube placement, it may be prudent to consider noncontrast chest CT to assess for degree of bullous disease superimposed in the right upper lobe. Critical Value/emergent results were called by telephone at the time of interpretation on 07/29/2017 at 10:27 am to Dr. Gareth Morgan, who verbally acknowledged these results. Electronically Signed   By: Gwyndolyn Saxon  Jasmine December III M.D.   On: 07/29/2017 10:27   Ct Chest Wo Contrast  Result Date: 07/29/2017 CLINICAL DATA:  Chest pain, shortness of Breath EXAM: CT CHEST WITHOUT CONTRAST TECHNIQUE: Multidetector CT imaging of the chest was performed following the standard protocol without IV contrast. COMPARISON:  Chest x-ray 07/29/2017 FINDINGS: Cardiovascular: Moderate coronary artery  calcifications, most pronounced in the left anterior descending coronary artery. Scattered aortic calcifications. No aneurysm. Heart is normal size. Mediastinum/Nodes: No mediastinal, hilar, or axillary adenopathy. Trachea and esophagus are unremarkable. Thyroid unremarkable. Lungs/Pleura: Large pneumothorax involving the left lung as seen on prior chest x-ray, 40-50% in size. Atelectasis in portions of the right upper lobe. There appears to be a large air-filled 11 in the right upper lobe. Mild centrilobular emphysema. No effusions. Upper Abdomen: Imaging into the upper abdomen shows no acute findings. Musculoskeletal: Chest wall soft tissues are unremarkable. No acute bony abnormality. IMPRESSION: Large right side pneumothorax, 40-50%. Areas of atelectasis in a right upper lobe. Large bleb noted in the right upper lobe/ apex. Coronary artery disease. Aortic Atherosclerosis (ICD10-I70.0) and Emphysema (ICD10-J43.9). Electronically Signed   By: Rolm Baptise M.D.   On: 07/29/2017 12:50   Dg Chest Port 1 View  Result Date: 07/31/2017 CLINICAL DATA:  Right pneumothorax. EXAM: PORTABLE CHEST 1 VIEW COMPARISON:  07/30/2017 FINDINGS: Lateral pigtail chest tube remains in place. Right pneumothorax shows slight enlargement since the prior chest x-ray with roughly 10-15% pneumothorax present. No significant pleural fluid. No pulmonary edema. IMPRESSION: Increase in right pneumothorax with roughly 10-15% pneumothorax present. Electronically Signed   By: Aletta Edouard M.D.   On: 07/31/2017 07:53   Portable Chest 1 View  Result Date: 07/30/2017 CLINICAL DATA:  Right side chest tube. EXAM: PORTABLE CHEST 1 VIEW COMPARISON:  07/29/2017 FINDINGS: Right pigtail pleural catheter remains in place, unchanged. Small residual right apical pneumothorax, less than 5%, decreased since prior study. No confluent airspace opacities. Heart is normal size. IMPRESSION: Decreasing size of the right apical pneumothorax, now less than  5%. Electronically Signed   By: Rolm Baptise M.D.   On: 07/30/2017 07:31   Dg Chest Port 1 View  Result Date: 07/29/2017 CLINICAL DATA:  Right-sided chest tube/pneumothorax. EXAM: PORTABLE CHEST 1 VIEW COMPARISON:  07/29/2017 FINDINGS: Interval placement of small caliber pigtail right-sided chest tube which appears in adequate position. There has been interval re-expansion of patient's right pneumothorax with small residual right apical pneumothorax remaining. Possible loculated component of pneumothorax in the right costophrenic angle. Linear scarring over the right mid to upper lung. Left lung is clear. Cardiomediastinal silhouette and remainder the exam is unchanged. IMPRESSION: Moderate interval re-expansion of patient's right-sided pneumothorax post small caliber pigtail chest tube placement. Small residual left apical pneumothorax. Linear atelectasis/ scarring left mid to upper lung. Electronically Signed   By: Marin Olp M.D.   On: 07/29/2017 17:47   Ct Image Guided Drainage By Percutaneous Catheter  Result Date: 07/29/2017 INDICATION: RIGHT SPONTANEOUS PNEUMOTHORAX EXAM: CT GUIDED 10 FRENCH RIGHT CHEST TUBE INSERTION MEDICATIONS: 1% LIDOCAINE LOCAL ANESTHESIA/SEDATION: Fentanyl 100 mcg IV; Versed 2.0 mg IV Moderate Sedation Time:  11 MINUTES The patient was continuously monitored during the procedure by the interventional radiology nurse under my direct supervision. COMPLICATIONS: None immediate. PROCEDURE: Informed written consent was obtained from the patient after a thorough discussion of the procedural risks, benefits and alternatives. All questions were addressed. Maximal Sterile Barrier Technique was utilized including caps, mask, sterile gowns, sterile gloves, sterile drape, hand hygiene and skin antiseptic. A timeout  was performed prior to the initiation of the procedure. Previous imaging reviewed. Patient positioned right anterior oblique. Noncontrast localization CT performed. The  large anterior component of the right pneumothorax was localized. Overlying skin marked in the mid axillary line. Under sterile conditions and local anesthesia, an 18 gauge introducer needle was advanced percutaneously into the right chest in the mid axillary line through an intercostal space. Needle position confirmed with CT. Guidewire inserted followed by tract dilatation to insert a 10 [place] drain. Drain catheter position confirmed with CT. Catheter secured with Prolene suture and connected to external Pleur-Evac. Vaseline guaze sterile dressing applied at the site. Patient tolerated the procedure well. No immediate complication. IMPRESSION: Successful CT-guided 10 French right chest tube insertion for spontaneous pneumothorax. Electronically Signed   By: Jerilynn Mages.  Shick M.D.   On: 07/29/2017 16:00    Labs:  CBC: Recent Labs    07/29/17 1135 07/29/17 1954 07/30/17 0252  WBC 8.9 11.0* 8.5  HGB 16.0 15.8 15.3  HCT 45.7 45.1 44.8  PLT 329 255 281    COAGS: Recent Labs    07/29/17 1142  INR 1.00    BMP: Recent Labs    07/29/17 1135 07/29/17 1954 07/30/17 0252  NA 136  --  136  K 4.3  --  4.3  CL 104  --  103  CO2 25  --  28  GLUCOSE 97  --  89  BUN 10  --  10  CALCIUM 10.8*  --  10.7*  CREATININE 0.78 0.79 0.89  GFRNONAA >60 >60 >60  GFRAA >60 >60 >60    LIVER FUNCTION TESTS: Recent Labs    07/29/17 1135  BILITOT 0.8  AST 14*  ALT 13*  ALKPHOS 63  PROT 7.4  ALBUMIN 4.3    Assessment and Plan:  Right chest tube intact Will follow Plan per Dr Lawson Fiscal  Electronically Signed: Monia Sabal A, PA-C 07/31/2017, 11:25 AM   I spent a total of 15 Minutes at the the patient's bedside AND on the patient's hospital floor or unit, greater than 50% of which was counseling/coordinating care for right chest tube

## 2017-07-31 NOTE — Progress Notes (Addendum)
[address]       [place] [address]             [phone]         Subjective: Feels fine , no sob  Objective: Vital signs in last 24 hours: Temp:  [97.5 F (36.4 C)-99.1 F (37.3 C)] 98.5 F (36.9 C) (12/26 0737) Pulse Rate:  [59-72] 72 (12/26 0333) Cardiac Rhythm: Normal sinus rhythm (12/26 0743) Resp:  [14-25] 23 (12/26 0737) BP: (108-129)/(69-86) 111/69 (12/26 0737) SpO2:  [90 %-97 %] 90 % (12/26 0737) Weight:  [175 lb 14.8 oz (79.8 kg)] 175 lb 14.8 oz (79.8 kg) (12/26 0421)  Hemodynamic parameters for last 24 hours:    Intake/Output from previous day: 12/25 0701 - 12/26 0700 In: 720 [P.O.:720] Out: 1335 [Urine:1325; Chest Tube:10] Intake/Output this shift: Total I/O In: -  Out: 210 [Urine:210]  General appearance: alert, cooperative and no distress Heart: regular rate and rhythm Lungs: clear to auscultation bilaterally Abdomen: benign Extremities: PAS in place, no calf tenderness or swelling  Lab Results: Recent Labs    07/29/17 1954 07/30/17 0252  WBC 11.0* 8.5  HGB 15.8 15.3  HCT 45.1 44.8  PLT 255 281   BMET:  Recent Labs    07/29/17 1135 07/29/17 1954 07/30/17 0252  NA 136  --  136  K 4.3  --  4.3  CL 104  --  103  CO2 25  --  28  GLUCOSE 97  --  89  BUN 10  --  10  CREATININE 0.78 0.79 0.89  CALCIUM 10.8*  --  10.7*    PT/INR:  Recent Labs    07/29/17 1142  LABPROT 13.1  INR 1.00   ABG No results found for: PHART, HCO3, TCO2, ACIDBASEDEF, O2SAT CBG (last 3)  No results for input(s): GLUCAP in the last 72 hours.  Meds Scheduled Meds: . aspirin EC  81 mg Oral Daily  . docusate sodium  100 mg Oral BID  . enoxaparin (LOVENOX) injection  40 mg Subcutaneous Q24H  . sodium chloride flush  3 mL Intravenous Q12H  . sodium chloride flush  3 mL Intravenous Q12H   Continuous Infusions: . sodium chloride     PRN Meds:.sodium chloride, fentaNYL (SUBLIMAZE) injection, levalbuterol, oxyCODONE, sodium  chloride flush, sorbitol  Xrays Dg Chest 2 View  Result Date: 07/29/2017 CLINICAL DATA:  Shortness of Breath EXAM: CHEST  2 VIEW COMPARISON:  April 15, 2015 chest radiograph and chest CT September 02, 2014 FINDINGS: There is an apparent pneumothorax involving a portion of the right upper hemithorax. No tension component. There is atelectatic change in the right upper lobe as well as atelectatic change in the right base. The left lung is clear. Heart size and pulmonary vascular normal. No adenopathy. No bone lesions. There is atherosclerotic calcification in the left carotid artery. IMPRESSION: Pneumothorax involving a portion of the right upper hemithorax. The pneumothorax does not extend entirely along the lateral right hemithorax, suggesting that there may be scar tissue in the right mid lung region preventing pneumothorax from becoming larger. Suspect ruptured bulla causing this change from prior study. Areas of atelectatic change on the right. Left lung clear. Heart size normal. There is calcification in the left carotid artery. Comment: Considering potential chest tube placement, it may be prudent to consider noncontrast chest CT to assess for degree of bullous disease superimposed in the right upper lobe. Critical Value/emergent results were called by telephone at the  time of interpretation on 07/29/2017 at 10:27 am to Dr. Gareth Morgan, who verbally acknowledged these results. Electronically Signed   By: Lowella Grip III M.D.   On: 07/29/2017 10:27   Ct Chest Wo Contrast  Result Date: 07/29/2017 CLINICAL DATA:  Chest pain, shortness of Breath EXAM: CT CHEST WITHOUT CONTRAST TECHNIQUE: Multidetector CT imaging of the chest was performed following the standard protocol without IV contrast. COMPARISON:  Chest x-ray 07/29/2017 FINDINGS: Cardiovascular: Moderate coronary artery calcifications, most pronounced in the left anterior descending coronary artery. Scattered aortic calcifications. No  aneurysm. Heart is normal size. Mediastinum/Nodes: No mediastinal, hilar, or axillary adenopathy. Trachea and esophagus are unremarkable. Thyroid unremarkable. Lungs/Pleura: Large pneumothorax involving the left lung as seen on prior chest x-ray, 40-50% in size. Atelectasis in portions of the right upper lobe. There appears to be a large air-filled 11 in the right upper lobe. Mild centrilobular emphysema. No effusions. Upper Abdomen: Imaging into the upper abdomen shows no acute findings. Musculoskeletal: Chest wall soft tissues are unremarkable. No acute bony abnormality. IMPRESSION: Large right side pneumothorax, 40-50%. Areas of atelectasis in a right upper lobe. Large bleb noted in the right upper lobe/ apex. Coronary artery disease. Aortic Atherosclerosis (ICD10-I70.0) and Emphysema (ICD10-J43.9). Electronically Signed   By: Rolm Baptise M.D.   On: 07/29/2017 12:50   Dg Chest Port 1 View  Result Date: 07/31/2017 CLINICAL DATA:  Right pneumothorax. EXAM: PORTABLE CHEST 1 VIEW COMPARISON:  07/30/2017 FINDINGS: Lateral pigtail chest tube remains in place. Right pneumothorax shows slight enlargement since the prior chest x-ray with roughly 10-15% pneumothorax present. No significant pleural fluid. No pulmonary edema. IMPRESSION: Increase in right pneumothorax with roughly 10-15% pneumothorax present. Electronically Signed   By: Aletta Edouard M.D.   On: 07/31/2017 07:53   Portable Chest 1 View  Result Date: 07/30/2017 CLINICAL DATA:  Right side chest tube. EXAM: PORTABLE CHEST 1 VIEW COMPARISON:  07/29/2017 FINDINGS: Right pigtail pleural catheter remains in place, unchanged. Small residual right apical pneumothorax, less than 5%, decreased since prior study. No confluent airspace opacities. Heart is normal size. IMPRESSION: Decreasing size of the right apical pneumothorax, now less than 5%. Electronically Signed   By: Rolm Baptise M.D.   On: 07/30/2017 07:31   Dg Chest Port 1 View  Result Date:  07/29/2017 CLINICAL DATA:  Right-sided chest tube/pneumothorax. EXAM: PORTABLE CHEST 1 VIEW COMPARISON:  07/29/2017 FINDINGS: Interval placement of small caliber pigtail right-sided chest tube which appears in adequate position. There has been interval re-expansion of patient's right pneumothorax with small residual right apical pneumothorax remaining. Possible loculated component of pneumothorax in the right costophrenic angle. Linear scarring over the right mid to upper lung. Left lung is clear. Cardiomediastinal silhouette and remainder the exam is unchanged. IMPRESSION: Moderate interval re-expansion of patient's right-sided pneumothorax post small caliber pigtail chest tube placement. Small residual left apical pneumothorax. Linear atelectasis/ scarring left mid to upper lung. Electronically Signed   By: Marin Olp M.D.   On: 07/29/2017 17:47   Ct Image Guided Drainage By Percutaneous Catheter  Result Date: 07/29/2017 INDICATION: RIGHT SPONTANEOUS PNEUMOTHORAX EXAM: CT GUIDED 10 FRENCH RIGHT CHEST TUBE INSERTION MEDICATIONS: 1% LIDOCAINE LOCAL ANESTHESIA/SEDATION: Fentanyl 100 mcg IV; Versed 2.0 mg IV Moderate Sedation Time:  11 MINUTES The patient was continuously monitored during the procedure by the interventional radiology nurse under my direct supervision. COMPLICATIONS: None immediate. PROCEDURE: Informed written consent was obtained from the patient after a thorough discussion of the procedural risks, benefits and alternatives. All questions  were addressed. Maximal Sterile Barrier Technique was utilized including caps, mask, sterile gowns, sterile gloves, sterile drape, hand hygiene and skin antiseptic. A timeout was performed prior to the initiation of the procedure. Previous imaging reviewed. Patient positioned right anterior oblique. Noncontrast localization CT performed. The large anterior component of the right pneumothorax was localized. Overlying skin marked in the mid axillary line.  Under sterile conditions and local anesthesia, an 18 gauge introducer needle was advanced percutaneously into the right chest in the mid axillary line through an intercostal space. Needle position confirmed with CT. Guidewire inserted followed by tract dilatation to insert a 10 [place] drain. Drain catheter position confirmed with CT. Catheter secured with Prolene suture and connected to external Pleur-Evac. Vaseline guaze sterile dressing applied at the site. Patient tolerated the procedure well. No immediate complication. IMPRESSION: Successful CT-guided 10 French right chest tube insertion for spontaneous pneumothorax. Electronically Signed   By: Jerilynn Mages.  Shick M.D.   On: 07/29/2017 16:00    Assessment/Plan:  1 stable with small air leak, tube on 20 cm H2O suction, pntx a bit larger10-15 percent, cont current plan, may need VATS 2 hemodyn stable with adeq sats on RA    LOS: 2 days    Cory Willis 07/31/2017 Patient examined, chest x-ray image personally reviewed Patient was bullous emphysema, spontaneous right pneumothorax and pigtail catheter with persistent air leak CT scan of chest shows a large right apical bleb. Patient will need right VATS for bleb resection on Friday if there is no improvement tomorrow.  patient examined and medical record reviewed,agree with above note. Tharon Aquas Trigt III 07/31/2017

## 2017-08-01 ENCOUNTER — Inpatient Hospital Stay (HOSPITAL_COMMUNITY): Payer: Medicare HMO

## 2017-08-01 DIAGNOSIS — J9311 Primary spontaneous pneumothorax: Secondary | ICD-10-CM

## 2017-08-01 LAB — BLOOD GAS, ARTERIAL
Acid-Base Excess: 0.5 mmol/L (ref 0.0–2.0)
Bicarbonate: 24.6 mmol/L (ref 20.0–28.0)
Drawn by: 331761
FIO2: 21
O2 Saturation: 94.9 %
Patient temperature: 98.6
pCO2 arterial: 39.2 mmHg (ref 32.0–48.0)
pH, Arterial: 7.413 (ref 7.350–7.450)
pO2, Arterial: 74.8 mmHg — ABNORMAL LOW (ref 83.0–108.0)

## 2017-08-01 LAB — CBC
HCT: 47.7 % (ref 39.0–52.0)
Hemoglobin: 16.3 g/dL (ref 13.0–17.0)
MCH: 31 pg (ref 26.0–34.0)
MCHC: 34.2 g/dL (ref 30.0–36.0)
MCV: 90.9 fL (ref 78.0–100.0)
Platelets: 322 10*3/uL (ref 150–400)
RBC: 5.25 MIL/uL (ref 4.22–5.81)
RDW: 13.3 % (ref 11.5–15.5)
WBC: 8.7 10*3/uL (ref 4.0–10.5)

## 2017-08-01 LAB — COMPREHENSIVE METABOLIC PANEL
ALT: 12 U/L — ABNORMAL LOW (ref 17–63)
AST: 17 U/L (ref 15–41)
Albumin: 4.1 g/dL (ref 3.5–5.0)
Alkaline Phosphatase: 62 U/L (ref 38–126)
Anion gap: 5 (ref 5–15)
BUN: 12 mg/dL (ref 6–20)
CO2: 27 mmol/L (ref 22–32)
Calcium: 11.5 mg/dL — ABNORMAL HIGH (ref 8.9–10.3)
Chloride: 104 mmol/L (ref 101–111)
Creatinine, Ser: 0.72 mg/dL (ref 0.61–1.24)
GFR calc Af Amer: >60 mL/min (ref >60)
GFR calc non Af Amer: >60 mL/min (ref >60)
Glucose, Bld: 99 mg/dL (ref 65–99)
Potassium: 4.7 mmol/L (ref 3.5–5.1)
Sodium: 136 mmol/L (ref 135–145)
Total Bilirubin: 0.8 mg/dL (ref 0.3–1.2)
Total Protein: 7.3 g/dL (ref 6.5–8.1)

## 2017-08-01 LAB — URINALYSIS, ROUTINE W REFLEX MICROSCOPIC
Bilirubin Urine: NEGATIVE
Glucose, UA: NEGATIVE mg/dL
Hgb urine dipstick: NEGATIVE
Ketones, ur: NEGATIVE mg/dL
Leukocytes, UA: NEGATIVE
Nitrite: NEGATIVE
Protein, ur: NEGATIVE mg/dL
Specific Gravity, Urine: 1.016 (ref 1.005–1.030)
pH: 6 (ref 5.0–8.0)

## 2017-08-01 LAB — APTT: aPTT: 35 seconds (ref 24–36)

## 2017-08-01 LAB — PROTIME-INR
INR: 1.07
Prothrombin Time: 13.8 seconds (ref 11.4–15.2)

## 2017-08-01 LAB — PREPARE RBC (CROSSMATCH)

## 2017-08-01 LAB — ABO/RH: ABO/RH(D): O NEG

## 2017-08-01 MED ORDER — DEXTROSE 5 % IV SOLN
1.5000 g | INTRAVENOUS | Status: AC
  Administered 2017-08-02: 1.5 g via INTRAVENOUS
  Filled 2017-08-01: qty 1.5

## 2017-08-01 NOTE — Progress Notes (Signed)
   08/01/17 1100  Clinical Encounter Type  Visited With Patient  Visit Type Initial  Referral From Chaplain  Consult/Referral To Chaplain  Spiritual Encounters  Spiritual Needs Emotional  Stress Factors  Patient Stress Factors Health changes   Patient was sitting in a chair in his room watching television. Patient talked about his medical diagnosis and was looking forward to another procedure the next day. Patient wants to read through the [place] document and will let charge nurse call back if he decides to accept it. Chaplain provided emotional support, compassionate presence and POA instructions.  Kashina Mecum a Medical sales representative, [employer]

## 2017-08-01 NOTE — Progress Notes (Signed)
Referring Physician(s): Dr Darcey Nora  Supervising Physician: Daryll Brod  Patient Status:  [facility] - In-pt  Chief Complaint:  Right spontaneous pneumothorax Right chest tube placed in IR 12/24  Subjective:  Chest tube intact + airleak No complaints CXR today IMPRESSION: Stable appearance of the approximately 15% right apical pneumothorax. The right chest tube is in stable position.    Allergies: Patient has no known allergies.  Medications: Prior to Admission medications   Medication Sig Start Date End Date Taking? Authorizing Provider  acetaminophen (TYLENOL) 500 MG tablet Take 500 mg by mouth daily as needed for headache.   Yes [provider]  chlorpheniramine (CHLOR-TRIMETON) 4 MG tablet Take 4 mg by mouth every 6 (six) hours as needed for allergies.   Yes [provider]  clindamycin (CLEOCIN) 150 MG capsule Take 2 capsules (300 mg total) by mouth 3 (three) times daily. May dispense as 150mg  capsules Patient not taking: Reported on 09/15/2015 09/15/15   Nona Dell, PA-C  HYDROcodone-acetaminophen [facility] 5-325 MG tablet Take 1 tablet by mouth every 6 (six) hours as needed for moderate pain. Patient not taking: Reported on 06/23/2015 06/02/15   Irine Seal, MD  simvastatin (ZOCOR) 20 MG tablet Take 1 tablet (20 mg total) by mouth at bedtime. Patient not taking: Reported on 06/23/2015 12/06/14   Saguier, Percell Miller, PA-C  tamsulosin (FLOMAX) 0.4 MG CAPS capsule Take 1 capsule (0.4 mg total) by mouth daily. Patient not taking: Reported on 07/29/2017 06/02/15   Irine Seal, MD     Vital Signs: BP 130/84 (BP Location: Right Arm)   Pulse 67   Temp 97.6 F (36.4 C) (Oral)   Resp (!) 24   Ht 5\' 8"  (1.727 m)   Wt 175 lb 14.8 oz (79.8 kg)   SpO2 94%   BMI 26.75 kg/m   Physical Exam  Pulmonary/Chest: Effort normal and breath sounds normal.  Skin: Skin is warm and dry.  Site of chest tube is clean and dry NT  Nursing note and vitals  reviewed.   Imaging: Dg Chest 2 View  Result Date: 07/29/2017 CLINICAL DATA:  Shortness of Breath EXAM: CHEST  2 VIEW COMPARISON:  April 15, 2015 chest radiograph and chest CT September 02, 2014 FINDINGS: There is an apparent pneumothorax involving a portion of the right upper hemithorax. No tension component. There is atelectatic change in the right upper lobe as well as atelectatic change in the right base. The left lung is clear. Heart size and pulmonary vascular normal. No adenopathy. No bone lesions. There is atherosclerotic calcification in the left carotid artery. IMPRESSION: Pneumothorax involving a portion of the right upper hemithorax. The pneumothorax does not extend entirely along the lateral right hemithorax, suggesting that there may be scar tissue in the right mid lung region preventing pneumothorax from becoming larger. Suspect ruptured bulla causing this change from prior study. Areas of atelectatic change on the right. Left lung clear. Heart size normal. There is calcification in the left carotid artery. Comment: Considering potential chest tube placement, it may be prudent to consider noncontrast chest CT to assess for degree of bullous disease superimposed in the right upper lobe. Critical Value/emergent results were called by telephone at the time of interpretation on 07/29/2017 at 10:27 am to Dr. Gareth Morgan, who verbally acknowledged these results. Electronically Signed   By: Lowella Grip III M.D.   On: 07/29/2017 10:27   Ct Chest Wo Contrast  Result Date: 07/29/2017 CLINICAL DATA:  Chest pain, shortness of Breath  EXAM: CT CHEST WITHOUT CONTRAST TECHNIQUE: Multidetector CT imaging of the chest was performed following the standard protocol without IV contrast. COMPARISON:  Chest x-ray 07/29/2017 FINDINGS: Cardiovascular: Moderate coronary artery calcifications, most pronounced in the left anterior descending coronary artery. Scattered aortic calcifications. No aneurysm.  Heart is normal size. Mediastinum/Nodes: No mediastinal, hilar, or axillary adenopathy. Trachea and esophagus are unremarkable. Thyroid unremarkable. Lungs/Pleura: Large pneumothorax involving the left lung as seen on prior chest x-ray, 40-50% in size. Atelectasis in portions of the right upper lobe. There appears to be a large air-filled 11 in the right upper lobe. Mild centrilobular emphysema. No effusions. Upper Abdomen: Imaging into the upper abdomen shows no acute findings. Musculoskeletal: Chest wall soft tissues are unremarkable. No acute bony abnormality. IMPRESSION: Large right side pneumothorax, 40-50%. Areas of atelectasis in a right upper lobe. Large bleb noted in the right upper lobe/ apex. Coronary artery disease. Aortic Atherosclerosis (ICD10-I70.0) and Emphysema (ICD10-J43.9). Electronically Signed   By: Rolm Baptise M.D.   On: 07/29/2017 12:50   Dg Chest Port 1 View  Result Date: 08/01/2017 CLINICAL DATA:  Right-sided pneumothorax with small caliber chest tube treatment. EXAM: PORTABLE CHEST 1 VIEW COMPARISON:  Chest x-ray of July 31, 2017 FINDINGS: A small right pneumothorax persists superiorly with the pleural line lying between the posterior aspects of the third and fourth ribs. This is stable. The small caliber chest tube projects over the posterolateral aspects of the right fifth and sixth ribs. The left lung is clear. There is no pleural effusion. The heart and pulmonary vascularity are normal. The trachea is midline. There is calcification in the wall of the thoracic aorta. There is no significant pleural effusion. IMPRESSION: Stable appearance of the approximately 15% right apical pneumothorax. The right chest tube is in stable position. Electronically Signed   By: David  Martinique M.D.   On: 08/01/2017 07:50   Dg Chest Port 1 View  Result Date: 07/31/2017 CLINICAL DATA:  Right pneumothorax. EXAM: PORTABLE CHEST 1 VIEW COMPARISON:  07/30/2017 FINDINGS: Lateral pigtail chest tube  remains in place. Right pneumothorax shows slight enlargement since the prior chest x-ray with roughly 10-15% pneumothorax present. No significant pleural fluid. No pulmonary edema. IMPRESSION: Increase in right pneumothorax with roughly 10-15% pneumothorax present. Electronically Signed   By: Aletta Edouard M.D.   On: 07/31/2017 07:53   Portable Chest 1 View  Result Date: 07/30/2017 CLINICAL DATA:  Right side chest tube. EXAM: PORTABLE CHEST 1 VIEW COMPARISON:  07/29/2017 FINDINGS: Right pigtail pleural catheter remains in place, unchanged. Small residual right apical pneumothorax, less than 5%, decreased since prior study. No confluent airspace opacities. Heart is normal size. IMPRESSION: Decreasing size of the right apical pneumothorax, now less than 5%. Electronically Signed   By: Rolm Baptise M.D.   On: 07/30/2017 07:31   Dg Chest Port 1 View  Result Date: 07/29/2017 CLINICAL DATA:  Right-sided chest tube/pneumothorax. EXAM: PORTABLE CHEST 1 VIEW COMPARISON:  07/29/2017 FINDINGS: Interval placement of small caliber pigtail right-sided chest tube which appears in adequate position. There has been interval re-expansion of patient's right pneumothorax with small residual right apical pneumothorax remaining. Possible loculated component of pneumothorax in the right costophrenic angle. Linear scarring over the right mid to upper lung. Left lung is clear. Cardiomediastinal silhouette and remainder the exam is unchanged. IMPRESSION: Moderate interval re-expansion of patient's right-sided pneumothorax post small caliber pigtail chest tube placement. Small residual left apical pneumothorax. Linear atelectasis/ scarring left mid to upper lung. Electronically Signed   By:  Marin Olp M.D.   On: 07/29/2017 17:47   Ct Image Guided Drainage By Percutaneous Catheter  Result Date: 07/29/2017 INDICATION: RIGHT SPONTANEOUS PNEUMOTHORAX EXAM: CT GUIDED 10 FRENCH RIGHT CHEST TUBE INSERTION MEDICATIONS: 1%  LIDOCAINE LOCAL ANESTHESIA/SEDATION: Fentanyl 100 mcg IV; Versed 2.0 mg IV Moderate Sedation Time:  11 MINUTES The patient was continuously monitored during the procedure by the interventional radiology nurse under my direct supervision. COMPLICATIONS: None immediate. PROCEDURE: Informed written consent was obtained from the patient after a thorough discussion of the procedural risks, benefits and alternatives. All questions were addressed. Maximal Sterile Barrier Technique was utilized including caps, mask, sterile gowns, sterile gloves, sterile drape, hand hygiene and skin antiseptic. A timeout was performed prior to the initiation of the procedure. Previous imaging reviewed. Patient positioned right anterior oblique. Noncontrast localization CT performed. The large anterior component of the right pneumothorax was localized. Overlying skin marked in the mid axillary line. Under sterile conditions and local anesthesia, an 18 gauge introducer needle was advanced percutaneously into the right chest in the mid axillary line through an intercostal space. Needle position confirmed with CT. Guidewire inserted followed by tract dilatation to insert a 10 [place] drain. Drain catheter position confirmed with CT. Catheter secured with Prolene suture and connected to external Pleur-Evac. Vaseline guaze sterile dressing applied at the site. Patient tolerated the procedure well. No immediate complication. IMPRESSION: Successful CT-guided 10 French right chest tube insertion for spontaneous pneumothorax. Electronically Signed   By: Jerilynn Mages.  Shick M.D.   On: 07/29/2017 16:00    Labs:  CBC: Recent Labs    07/29/17 1135 07/29/17 1954 07/30/17 0252 08/01/17 1038  WBC 8.9 11.0* 8.5 8.7  HGB 16.0 15.8 15.3 16.3  HCT 45.7 45.1 44.8 47.7  PLT 329 255 281 322    COAGS: Recent Labs    07/29/17 1142 08/01/17 1038  INR 1.00 1.07  APTT  --  35    BMP: Recent Labs    07/29/17 1135 07/29/17 1954 07/30/17 0252  08/01/17 1038  NA 136  --  136 136  K 4.3  --  4.3 4.7  CL 104  --  103 104  CO2 25  --  28 27  GLUCOSE 97  --  89 99  BUN 10  --  10 12  CALCIUM 10.8*  --  10.7* 11.5*  CREATININE 0.78 0.79 0.89 0.72  GFRNONAA >60 >60 >60 >60  GFRAA >60 >60 >60 >60    LIVER FUNCTION TESTS: Recent Labs    07/29/17 1135 08/01/17 1038  BILITOT 0.8 0.8  AST 14* 17  ALT 13* 12*  ALKPHOS 63 62  PROT 7.4 7.3  ALBUMIN 4.3 4.1    Assessment and Plan:  Rt spontaneous PTX Chest tube placed 12/24 in IR For VATS in am per chart  Electronically Signed: Kadejah Sandiford A, PA-C 08/01/2017, 1:13 PM   I spent a total of 15 Minutes at the the patient's bedside AND on the patient's hospital floor or unit, greater than 50% of which was counseling/coordinating care for rt chest tube

## 2017-08-01 NOTE — Progress Notes (Addendum)
      [address]       [place] [address]             [phone]        Procedure(s) (LRB): VIDEO ASSISTED THORACOSCOPY (Right) Subjective: Main concern is about moving propane tanks when he gets home if he has a weight limit after surgery  Objective: Vital signs in last 24 hours: Temp:  [97.8 F (36.6 C)] 97.8 F (36.6 C) (12/27 0418) Pulse Rate:  [58-69] 58 (12/27 0418) Cardiac Rhythm: Heart block (12/27 0700) Resp:  [15-21] 18 (12/27 0418) BP: (95-138)/(62-89) 95/62 (12/27 0418) SpO2:  [93 %-95 %] 93 % (12/27 0418)     Intake/Output from previous day: 12/26 0701 - 12/27 0700 In: 120 [P.O.:120] Out: 1925 [Urine:1925] Intake/Output this shift: No intake/output data recorded.  General appearance: alert, cooperative and no distress Heart: regular rate and rhythm, S1, S2 normal, no murmur, click, rub or gallop Lungs: clear to auscultation bilaterally and diminshed in RUL Abdomen: soft, non-tender; bowel sounds normal; no masses,  no organomegaly Extremities: extremities normal, atraumatic, no cyanosis or edema Wound: chest tube site secure.  Lab Results: Recent Labs    07/29/17 1954 07/30/17 0252  WBC 11.0* 8.5  HGB 15.8 15.3  HCT 45.1 44.8  PLT 255 281   BMET:  Recent Labs    07/29/17 1135 07/29/17 1954 07/30/17 0252  NA 136  --  136  K 4.3  --  4.3  CL 104  --  103  CO2 25  --  28  GLUCOSE 97  --  89  BUN 10  --  10  CREATININE 0.78 0.79 0.89  CALCIUM 10.8*  --  10.7*    PT/INR:  Recent Labs    07/29/17 1142  LABPROT 13.1  INR 1.00   ABG No results found for: PHART, HCO3, TCO2, ACIDBASEDEF, O2SAT CBG (last 3)  No results for input(s): GLUCAP in the last 72 hours.  Assessment/Plan: S/P Procedure(s) (LRB): VIDEO ASSISTED THORACOSCOPY (Right)  1. CXR this morning showed a stable 15% right apical pneumothorax. There is no significant pleural effusion.  2. NSR in the 60s to SB. BP low normal this morning. Oxygen  saturation has been stable on room air. 3. [place] His heat relies on him moving 35 lb propane tanks to his camper. He is concerned about how he will be able to accomplish this after surgery.   Plan: possible VATs with bleb resection tomorrow with Dr. Prescott Gum.    LOS: 3 days    Elgie Collard 08/01/2017 Persistent R pneumothorax, air leak despite Pigtail chest tube Will need VATS for definitive treatment Procedure, benefits and risks d/w patient patient examined and medical record reviewed,agree with above note. Tharon Aquas Trigt III 08/01/2017

## 2017-08-02 ENCOUNTER — Inpatient Hospital Stay (HOSPITAL_COMMUNITY): Payer: Medicare HMO | Admitting: Certified Registered"

## 2017-08-02 ENCOUNTER — Inpatient Hospital Stay (HOSPITAL_COMMUNITY): Payer: Medicare HMO

## 2017-08-02 ENCOUNTER — Encounter (HOSPITAL_COMMUNITY)
Admission: EM | Disposition: A | Discharge status: Home / Self Care | Payer: Self-pay | Source: Home / Self Care | Attending: Cardiothoracic Surgery

## 2017-08-02 ENCOUNTER — Encounter (HOSPITAL_COMMUNITY): Payer: Self-pay | Admitting: Certified Registered"

## 2017-08-02 DIAGNOSIS — J9311 Primary spontaneous pneumothorax: Secondary | ICD-10-CM

## 2017-08-02 HISTORY — PX: VIDEO ASSISTED THORACOSCOPY: SHX5073

## 2017-08-02 LAB — POCT I-STAT 3, ART BLOOD GAS (G3+)
Acid-base deficit: 1 mmol/L (ref 0.0–2.0)
Bicarbonate: 25.7 mmol/L (ref 20.0–28.0)
O2 Saturation: 95 %
Patient temperature: 98.2
TCO2: 27 mmol/L (ref 22–32)
pCO2 arterial: 48.3 mmHg — ABNORMAL HIGH (ref 32.0–48.0)
pH, Arterial: 7.333 — ABNORMAL LOW (ref 7.350–7.450)
pO2, Arterial: 79 mmHg — ABNORMAL LOW (ref 83.0–108.0)

## 2017-08-02 SURGERY — VIDEO ASSISTED THORACOSCOPY
Anesthesia: General | Site: Chest | Laterality: Right

## 2017-08-02 MED ORDER — POTASSIUM CHLORIDE 10 MEQ/100ML IV SOLN: 10.0000 meq | Freq: Every day | INTRAVENOUS | Status: DC | PRN

## 2017-08-02 MED ORDER — ACETAMINOPHEN 500 MG PO TABS
1000.0000 mg | ORAL_TABLET | Freq: Four times a day (QID) | ORAL | Status: DC
  Administered 2017-08-02 – 2017-08-06 (×14): 1000 mg via ORAL
  Filled 2017-08-02 (×14): qty 2

## 2017-08-02 MED ORDER — HYDROMORPHONE HCL 1 MG/ML IJ SOLN
0.2500 mg | INTRAMUSCULAR | Status: DC | PRN
  Administered 2017-08-02 (×2): 0.5 mg via INTRAVENOUS

## 2017-08-02 MED ORDER — BISACODYL 5 MG PO TBEC
10.0000 mg | DELAYED_RELEASE_TABLET | Freq: Every day | ORAL | Status: DC
  Administered 2017-08-02 – 2017-08-05 (×4): 10 mg via ORAL
  Filled 2017-08-02 (×4): qty 2

## 2017-08-02 MED ORDER — OXYCODONE HCL 5 MG PO TABS: 5.0000 mg | ORAL_TABLET | ORAL | Status: DC | PRN

## 2017-08-02 MED ORDER — PROPOFOL 10 MG/ML IV BOLUS
INTRAVENOUS | Status: AC
  Filled 2017-08-02: qty 20

## 2017-08-02 MED ORDER — METOCLOPRAMIDE HCL 5 MG/ML IJ SOLN
10.0000 mg | Freq: Four times a day (QID) | INTRAMUSCULAR | Status: DC
  Administered 2017-08-02 – 2017-08-03 (×2): 10 mg via INTRAVENOUS
  Filled 2017-08-02 (×2): qty 2

## 2017-08-02 MED ORDER — DIPHENHYDRAMINE HCL 50 MG/ML IJ SOLN
12.5000 mg | Freq: Four times a day (QID) | INTRAMUSCULAR | Status: DC | PRN
  Filled 2017-08-02: qty 1

## 2017-08-02 MED ORDER — HYDRALAZINE HCL 20 MG/ML IJ SOLN
INTRAMUSCULAR | Status: AC
  Filled 2017-08-02: qty 1

## 2017-08-02 MED ORDER — 0.9 % SODIUM CHLORIDE (POUR BTL) OPTIME
TOPICAL | Status: DC | PRN
  Administered 2017-08-02: 1000 mL

## 2017-08-02 MED ORDER — DEXTROSE-NACL 5-0.45 % IV SOLN
INTRAVENOUS | Status: DC
  Administered 2017-08-02: 19:00:00 via INTRAVENOUS

## 2017-08-02 MED ORDER — SUCCINYLCHOLINE CHLORIDE 20 MG/ML IJ SOLN
INTRAMUSCULAR | Status: DC | PRN
  Administered 2017-08-02: 80 mg via INTRAVENOUS

## 2017-08-02 MED ORDER — ROCURONIUM BROMIDE 100 MG/10ML IV SOLN
INTRAVENOUS | Status: DC | PRN
  Administered 2017-08-02: 30 mg via INTRAVENOUS

## 2017-08-02 MED ORDER — LACTATED RINGERS IV SOLN
INTRAVENOUS | Status: DC | PRN
  Administered 2017-08-02: 14:00:00 via INTRAVENOUS

## 2017-08-02 MED ORDER — PHENYLEPHRINE 40 MCG/ML (10ML) SYRINGE FOR IV PUSH (FOR BLOOD PRESSURE SUPPORT)
PREFILLED_SYRINGE | INTRAVENOUS | Status: AC
  Filled 2017-08-02: qty 30

## 2017-08-02 MED ORDER — TRAMADOL HCL 50 MG PO TABS
50.0000 mg | ORAL_TABLET | Freq: Four times a day (QID) | ORAL | Status: DC | PRN
  Administered 2017-08-02 – 2017-08-03 (×2): 100 mg via ORAL
  Filled 2017-08-02 (×2): qty 2

## 2017-08-02 MED ORDER — PROPOFOL 10 MG/ML IV BOLUS
INTRAVENOUS | Status: DC | PRN
  Administered 2017-08-02: 40 mg via INTRAVENOUS
  Administered 2017-08-02: 30 mg via INTRAVENOUS
  Administered 2017-08-02: 50 mg via INTRAVENOUS
  Administered 2017-08-02: 30 mg via INTRAVENOUS

## 2017-08-02 MED ORDER — ESMOLOL HCL 100 MG/10ML IV SOLN
INTRAVENOUS | Status: AC
  Filled 2017-08-02: qty 10

## 2017-08-02 MED ORDER — HEMOSTATIC AGENTS (NO CHARGE) OPTIME
TOPICAL | Status: DC | PRN
  Administered 2017-08-02: 1 via TOPICAL

## 2017-08-02 MED ORDER — ESMOLOL HCL 100 MG/10ML IV SOLN
INTRAVENOUS | Status: DC | PRN
  Administered 2017-08-02: 40 mg via INTRAVENOUS

## 2017-08-02 MED ORDER — NALOXONE HCL 0.4 MG/ML IJ SOLN: 0.4000 mg | INTRAMUSCULAR | Status: DC | PRN

## 2017-08-02 MED ORDER — LIDOCAINE 2% (20 MG/ML) 5 ML SYRINGE
INTRAMUSCULAR | Status: AC
  Filled 2017-08-02: qty 5

## 2017-08-02 MED ORDER — FENTANYL CITRATE (PF) 250 MCG/5ML IJ SOLN
INTRAMUSCULAR | Status: AC
  Filled 2017-08-02: qty 5

## 2017-08-02 MED ORDER — MIDAZOLAM HCL 2 MG/2ML IJ SOLN
INTRAMUSCULAR | Status: AC
  Filled 2017-08-02: qty 2

## 2017-08-02 MED ORDER — LACTATED RINGERS IV SOLN
INTRAVENOUS | Status: DC
  Administered 2017-08-02: 12:00:00 via INTRAVENOUS

## 2017-08-02 MED ORDER — SUGAMMADEX SODIUM 200 MG/2ML IV SOLN
INTRAVENOUS | Status: DC | PRN
  Administered 2017-08-02: 150 mg via INTRAVENOUS

## 2017-08-02 MED ORDER — PHENYLEPHRINE HCL 10 MG/ML IJ SOLN
INTRAVENOUS | Status: DC | PRN
  Administered 2017-08-02: 10 ug/min via INTRAVENOUS

## 2017-08-02 MED ORDER — ONDANSETRON HCL 4 MG/2ML IJ SOLN
INTRAMUSCULAR | Status: DC | PRN
  Administered 2017-08-02: 4 mg via INTRAVENOUS

## 2017-08-02 MED ORDER — ONDANSETRON HCL 4 MG/2ML IJ SOLN
INTRAMUSCULAR | Status: AC
  Filled 2017-08-02: qty 2

## 2017-08-02 MED ORDER — SENNOSIDES-DOCUSATE SODIUM 8.6-50 MG PO TABS
1.0000 | ORAL_TABLET | Freq: Every day | ORAL | Status: DC
  Administered 2017-08-02 – 2017-08-04 (×2): 1 via ORAL
  Filled 2017-08-02 (×2): qty 1

## 2017-08-02 MED ORDER — HYDROMORPHONE 1 MG/ML IV SOLN
INTRAVENOUS | Status: AC
  Filled 2017-08-02: qty 25

## 2017-08-02 MED ORDER — HYDROMORPHONE HCL 1 MG/ML IJ SOLN
INTRAMUSCULAR | Status: AC
  Filled 2017-08-02: qty 1

## 2017-08-02 MED ORDER — OXYCODONE HCL 5 MG PO TABS: 5.0000 mg | ORAL_TABLET | Freq: Once | ORAL | Status: DC | PRN

## 2017-08-02 MED ORDER — SIMVASTATIN 20 MG PO TABS: 20.0000 mg | ORAL_TABLET | Freq: Every day | ORAL | Status: DC

## 2017-08-02 MED ORDER — HYDROMORPHONE 1 MG/ML IV SOLN
INTRAVENOUS | Status: DC
  Administered 2017-08-02: 0.2 mg via INTRAVENOUS

## 2017-08-02 MED ORDER — DEXAMETHASONE SODIUM PHOSPHATE 10 MG/ML IJ SOLN
INTRAMUSCULAR | Status: AC
  Filled 2017-08-02: qty 1

## 2017-08-02 MED ORDER — ACETAMINOPHEN 160 MG/5ML PO SOLN: 1000.0000 mg | Freq: Four times a day (QID) | ORAL | Status: DC

## 2017-08-02 MED ORDER — ONDANSETRON HCL 4 MG/2ML IJ SOLN: 4.0000 mg | Freq: Four times a day (QID) | INTRAMUSCULAR | Status: DC | PRN

## 2017-08-02 MED ORDER — DEXTROSE 5 % IV SOLN
1.5000 g | Freq: Two times a day (BID) | INTRAVENOUS | Status: AC
  Administered 2017-08-02 – 2017-08-03 (×2): 1.5 g via INTRAVENOUS
  Filled 2017-08-02 (×2): qty 1.5

## 2017-08-02 MED ORDER — TAMSULOSIN HCL 0.4 MG PO CAPS: 0.4000 mg | ORAL_CAPSULE | Freq: Every day | ORAL | Status: DC

## 2017-08-02 MED ORDER — FENTANYL CITRATE (PF) 100 MCG/2ML IJ SOLN
INTRAMUSCULAR | Status: AC
  Filled 2017-08-02: qty 2

## 2017-08-02 MED ORDER — MIDAZOLAM HCL 5 MG/5ML IJ SOLN
INTRAMUSCULAR | Status: DC | PRN
  Administered 2017-08-02: 2 mg via INTRAVENOUS

## 2017-08-02 MED ORDER — ROCURONIUM BROMIDE 10 MG/ML (PF) SYRINGE
PREFILLED_SYRINGE | INTRAVENOUS | Status: AC
  Filled 2017-08-02: qty 10

## 2017-08-02 MED ORDER — OXYCODONE HCL 5 MG/5ML PO SOLN: 5.0000 mg | Freq: Once | ORAL | Status: DC | PRN

## 2017-08-02 MED ORDER — HYDRALAZINE HCL 20 MG/ML IJ SOLN
INTRAMUSCULAR | Status: DC | PRN
  Administered 2017-08-02 (×2): 5 mg via INTRAVENOUS

## 2017-08-02 MED ORDER — SODIUM CHLORIDE 0.9% FLUSH: 9.0000 mL | INTRAVENOUS | Status: DC | PRN

## 2017-08-02 MED ORDER — DIPHENHYDRAMINE HCL 12.5 MG/5ML PO ELIX: 12.5000 mg | ORAL_SOLUTION | Freq: Four times a day (QID) | ORAL | Status: DC | PRN

## 2017-08-02 MED ORDER — LIDOCAINE HCL (CARDIAC) 20 MG/ML IV SOLN
INTRAVENOUS | Status: DC | PRN
  Administered 2017-08-02: 50 mg via INTRAVENOUS

## 2017-08-02 SURGICAL SUPPLY — 70 items
APPLICATOR TIP EXT COSEAL (VASCULAR PRODUCTS) ×3 IMPLANT
BAG DECANTER FOR FLEXI CONT (MISCELLANEOUS) IMPLANT
BLADE SURG 11 STRL SS (BLADE) ×3 IMPLANT
CANISTER SUCT 3000ML PPV (MISCELLANEOUS) ×3 IMPLANT
CATH KIT ON Q 5IN SLV (PAIN MANAGEMENT) IMPLANT
CATH ROBINSON RED A/P 22FR (CATHETERS) IMPLANT
CATH THORACIC 28FR (CATHETERS) IMPLANT
CATH THORACIC 36FR (CATHETERS) IMPLANT
CATH THORACIC 36FR RT ANG (CATHETERS) IMPLANT
CLEANER TIP ELECTROSURG 2X2 (MISCELLANEOUS) ×3 IMPLANT
CLIP VESOCCLUDE MED 24/CT (CLIP) ×3 IMPLANT
CONT SPEC 4OZ CLIKSEAL STRL BL (MISCELLANEOUS) ×6 IMPLANT
COVER SURGICAL LIGHT HANDLE (MISCELLANEOUS) ×6 IMPLANT
DERMABOND ADVANCED (GAUZE/BANDAGES/DRESSINGS)
DERMABOND ADVANCED .7 DNX12 (GAUZE/BANDAGES/DRESSINGS) IMPLANT
DRAPE LAPAROSCOPIC ABDOMINAL (DRAPES) ×6 IMPLANT
ELECT BLADE 4.0 EZ CLEAN MEGAD (MISCELLANEOUS) ×3
ELECT REM PT RETURN 9FT ADLT (ELECTROSURGICAL) ×3
ELECTRODE BLDE 4.0 EZ CLN MEGD (MISCELLANEOUS) ×1 IMPLANT
ELECTRODE REM PT RTRN 9FT ADLT (ELECTROSURGICAL) ×1 IMPLANT
GAUZE SPONGE 4X4 12PLY STRL LF (GAUZE/BANDAGES/DRESSINGS) ×3 IMPLANT
GLOVE BIO SURGEON STRL SZ 6.5 (GLOVE) ×2 IMPLANT
GLOVE BIO SURGEON STRL SZ7.5 (GLOVE) ×6 IMPLANT
GLOVE BIO SURGEONS STRL SZ 6.5 (GLOVE) ×1
GOWN STRL REUS W/ TWL LRG LVL3 (GOWN DISPOSABLE) ×3 IMPLANT
GOWN STRL REUS W/TWL LRG LVL3 (GOWN DISPOSABLE) ×6
KIT BASIN OR (CUSTOM PROCEDURE TRAY) ×3 IMPLANT
KIT ROOM TURNOVER OR (KITS) ×3 IMPLANT
KIT SUCTION CATH 14FR (SUCTIONS) ×3 IMPLANT
NS IRRIG 1000ML POUR BTL (IV SOLUTION) ×6 IMPLANT
PACK CHEST (CUSTOM PROCEDURE TRAY) ×3 IMPLANT
PAD ARMBOARD 7.5X6 YLW CONV (MISCELLANEOUS) ×6 IMPLANT
SEALANT PROGEL (MISCELLANEOUS) ×3 IMPLANT
SEALANT SURG COSEAL 4ML (VASCULAR PRODUCTS) IMPLANT
SEALANT SURG COSEAL 8ML (VASCULAR PRODUCTS) ×3 IMPLANT
SOLUTION ANTI FOG 6CC (MISCELLANEOUS) ×3 IMPLANT
SPONGE TONSIL 1 RF SGL (DISPOSABLE) ×6 IMPLANT
STAPLE RELOAD 45MM GOLD (STAPLE) ×3 IMPLANT
STAPLER ECHELON POWERED (MISCELLANEOUS) ×3 IMPLANT
SUT CHROMIC 3 0 SH 27 (SUTURE) IMPLANT
SUT CHROMIC 4 0 SH 27 (SUTURE) ×6 IMPLANT
SUT ETHIBOND X763 2 0 SH 1 (SUTURE) ×3 IMPLANT
SUT ETHILON 3 0 PS 1 (SUTURE) IMPLANT
SUT PROLENE 3 0 SH DA (SUTURE) IMPLANT
SUT PROLENE 4 0 RB 1 (SUTURE)
SUT PROLENE 4-0 RB1 .5 CRCL 36 (SUTURE) IMPLANT
SUT SILK  1 MH (SUTURE) ×8
SUT SILK 1 MH (SUTURE) ×4 IMPLANT
SUT SILK 2 0SH CR/8 30 (SUTURE) IMPLANT
SUT SILK 3 0SH CR/8 30 (SUTURE) IMPLANT
SUT VIC AB 1 CTX 18 (SUTURE) ×6 IMPLANT
SUT VIC AB 2 TP1 27 (SUTURE) IMPLANT
SUT VIC AB 2-0 CT1 27 (SUTURE) ×2
SUT VIC AB 2-0 CT1 TAPERPNT 27 (SUTURE) ×1 IMPLANT
SUT VIC AB 2-0 CT2 18 VCP726D (SUTURE) IMPLANT
SUT VIC AB 2-0 CTX 27 (SUTURE) ×3 IMPLANT
SUT VIC AB 2-0 CTX 36 (SUTURE) IMPLANT
SUT VIC AB 3-0 SH 18 (SUTURE) IMPLANT
SUT VIC AB 3-0 X1 27 (SUTURE) ×3 IMPLANT
SUT VICRYL 0 UR6 27IN ABS (SUTURE) ×9 IMPLANT
SUT VICRYL 2 TP 1 (SUTURE) IMPLANT
SWAB COLLECTION DEVICE MRSA (MISCELLANEOUS) IMPLANT
SWAB CULTURE ESWAB REG 1ML (MISCELLANEOUS) IMPLANT
SYSTEM SAHARA CHEST DRAIN ATS (WOUND CARE) ×3 IMPLANT
TAPE CLOTH SURG 4X10 WHT LF (GAUZE/BANDAGES/DRESSINGS) ×3 IMPLANT
TIP APPLICATOR SPRAY EXTEND 16 (VASCULAR PRODUCTS) IMPLANT
TOWEL GREEN STERILE (TOWEL DISPOSABLE) ×3 IMPLANT
TRAP SPECIMEN MUCOUS 40CC (MISCELLANEOUS) IMPLANT
TRAY FOLEY W/METER SILVER 16FR (SET/KITS/TRAYS/PACK) ×3 IMPLANT
WATER STERILE IRR 1000ML POUR (IV SOLUTION) ×6 IMPLANT

## 2017-08-02 NOTE — Anesthesia Procedure Notes (Signed)
Arterial Line Insertion Start/End12/28/2018 12:39 PM, 08/02/2017 12:39 PM Performed by: Julieta Bellini, CRNA, CRNA  Preanesthetic checklist: patient identified, IV checked, site marked, risks and benefits discussed, surgical consent, monitors and equipment checked and pre-op evaluation Lidocaine 1% used for infiltration Left, radial was placed Catheter size: 20 G Hand hygiene performed  and maximum sterile barriers used  Allen's test indicative of satisfactory collateral circulation Attempts: 1 Procedure performed without using ultrasound guided technique. Following insertion, dressing applied and Biopatch. Post procedure assessment: normal  Patient tolerated the procedure well with no immediate complications.

## 2017-08-02 NOTE — Transfer of Care (Signed)
Immediate Anesthesia Transfer of Care Note  Patient: Cory Willis  Procedure(s) Performed: VIDEO ASSISTED THORACOSCOPY WITH BLEB RESECTION (Right Chest)  Patient Location: PACU  Anesthesia Type:General  Level of Consciousness: lethargic and responds to stimulation  Airway & Oxygen Therapy: Patient Spontanous Breathing and Patient connected to face mask oxygen  Post-op Assessment: Report given to RN and Post -op Vital signs reviewed and stable  Post vital signs: Reviewed and stable  Last Vitals:  Vitals:   08/02/17 0755 08/02/17 1133  BP: 118/73   Pulse:    Resp: 16   Temp: 36.8 C 36.7 C  SpO2: 97%     Last Pain:  Vitals:   08/02/17 1133  TempSrc: Oral  PainSc:       Patients Stated Pain Goal: 0 (67/70/34 0352)  Complications: No apparent anesthesia complications

## 2017-08-02 NOTE — Progress Notes (Signed)
Pre Procedure note for inpatients:   Cory Willis has been scheduled for Procedure(s) with comments: VIDEO ASSISTED THORACOSCOPY (Right) - VATS/resection bleb today. The various methods of treatment have been discussed with the patient. After consideration of the risks, benefits and treatment options the patient has consented to the planned procedure.   The patient has been seen and labs reviewed. There are no changes in the patient's condition to prevent proceeding with the planned procedure today.  Recent labs:  Lab Results  Component Value Date   WBC 8.7 08/01/2017   HGB 16.3 08/01/2017   HCT 47.7 08/01/2017   PLT 322 08/01/2017   GLUCOSE 99 08/01/2017   CHOL 178 12/06/2014   TRIG 82.0 12/06/2014   HDL 50.10 12/06/2014   LDLCALC 112 (H) 12/06/2014   ALT 12 (L) 08/01/2017   AST 17 08/01/2017   NA 136 08/01/2017   K 4.7 08/01/2017   CL 104 08/01/2017   CREATININE 0.72 08/01/2017   BUN 12 08/01/2017   CO2 27 08/01/2017   PSA 6.22 (H) 08/26/2014   INR 1.07 08/01/2017    Len Childs, MD 08/02/2017 7:25 AM

## 2017-08-02 NOTE — Anesthesia Procedure Notes (Signed)
Procedure Name: Intubation Date/Time: 08/02/2017 2:05 PM Performed by: Gaylene Brooks, CRNA Pre-anesthesia Checklist: Patient identified, Emergency Drugs available, Suction available and Patient being monitored Patient Re-evaluated:Patient Re-evaluated prior to induction Oxygen Delivery Method: Circle System Utilized Preoxygenation: Pre-oxygenation with 100% oxygen Induction Type: IV induction Ventilation: Mask ventilation without difficulty and Two handed mask ventilation required Laryngoscope Size: Miller and 2 Grade View: Grade II Tube type: Oral Endobronchial tube: Left, Double lumen EBT and EBT position confirmed by fiberoptic bronchoscope and 39 Fr Number of attempts: 1 Airway Equipment and Method: Stylet and Oral airway Placement Confirmation: ETT inserted through vocal cords under direct vision,  positive ETCO2 and breath sounds checked- equal and bilateral Tube secured with: Tape Dental Injury: Teeth and Oropharynx as per pre-operative assessment

## 2017-08-02 NOTE — Progress Notes (Signed)
08/02/2017 1900 Upon arrival to SICU, pt. Noted to be very lethargic but arousable. Able to follow commands. Periods of apnea. ABG collected and WNL. PCA paused for now due to lethargy. Dr. Prescott Gum on floor and made aware. No new orders at this time. Will continue to closely monitor patient.  Shabreka Coulon, Arville Lime

## 2017-08-02 NOTE — Brief Op Note (Signed)
07/29/2017 - 08/02/2017  4:02 PM  PATIENT:  Berneta Sages [place]  66 y.o. male  PRE-OPERATIVE DIAGNOSIS:  pneumothorax  POST-OPERATIVE DIAGNOSIS:  pneumothorax  PROCEDURE:  Procedure(s) with comments : VIDEO ASSISTED THORACOSCOPY/MINI THORACOTOMY -Resection Right Apical Bleb -Mechanical Pleurodesis  SURGEON:  Surgeon(s) and Role:    Ivin Poot, MD - Primary  PHYSICIAN ASSISTANT: Ellwood Handler PA-C  ANESTHESIA:   general  EBL:  50 mL   BLOOD ADMINISTERED:none  DRAINS: 28 Straight Chest Tube   LOCAL MEDICATIONS USED:  NONE  SPECIMEN:  Source of Specimen:  Right Apical Blebn  DISPOSITION OF SPECIMEN:  PATHOLOGY  COUNTS:  YES  TOURNIQUET:  * No tourniquets in log *  DICTATION: .Dragon Dictation  PLAN OF CARE: Admit to inpatient   PATIENT DISPOSITION:  ICU - extubated and stable.   Delay start of Pharmacological VTE agent (>24hrs) due to surgical blood loss or risk of bleeding: yes

## 2017-08-03 ENCOUNTER — Inpatient Hospital Stay (HOSPITAL_COMMUNITY): Payer: Medicare HMO

## 2017-08-03 LAB — POCT I-STAT 3, ART BLOOD GAS (G3+)
Acid-base deficit: 1 mmol/L (ref 0.0–2.0)
Bicarbonate: 26 mmol/L (ref 20.0–28.0)
O2 Saturation: 96 %
Patient temperature: 97.9
TCO2: 28 mmol/L (ref 22–32)
pCO2 arterial: 50.2 mmHg — ABNORMAL HIGH (ref 32.0–48.0)
pH, Arterial: 7.321 — ABNORMAL LOW (ref 7.350–7.450)
pO2, Arterial: 88 mmHg (ref 83.0–108.0)

## 2017-08-03 LAB — BLOOD GAS, ARTERIAL
Acid-Base Excess: 3.6 mmol/L — ABNORMAL HIGH (ref 0.0–2.0)
Bicarbonate: 28.2 mmol/L — ABNORMAL HIGH (ref 20.0–28.0)
DRAWN BY: 511551
O2 CONTENT: 4 L/min
O2 SAT: 98 %
PATIENT TEMPERATURE: 98.6
pCO2 arterial: 46.9 mmHg (ref 32.0–48.0)
pH, Arterial: 7.396 (ref 7.350–7.450)
pO2, Arterial: 114 mmHg — ABNORMAL HIGH (ref 83.0–108.0)

## 2017-08-03 LAB — BASIC METABOLIC PANEL
ANION GAP: 8 (ref 5–15)
BUN: 11 mg/dL (ref 6–20)
CALCIUM: 10.4 mg/dL — AB (ref 8.9–10.3)
CO2: 27 mmol/L (ref 22–32)
Chloride: 98 mmol/L — ABNORMAL LOW (ref 101–111)
Creatinine, Ser: 0.63 mg/dL (ref 0.61–1.24)
Glucose, Bld: 136 mg/dL — ABNORMAL HIGH (ref 65–99)
Potassium: 4.2 mmol/L (ref 3.5–5.1)
SODIUM: 133 mmol/L — AB (ref 135–145)

## 2017-08-03 LAB — CBC
HCT: 44.1 % (ref 39.0–52.0)
HEMOGLOBIN: 14.9 g/dL (ref 13.0–17.0)
MCH: 30.7 pg (ref 26.0–34.0)
MCHC: 33.8 g/dL (ref 30.0–36.0)
MCV: 90.9 fL (ref 78.0–100.0)
Platelets: 328 10*3/uL (ref 150–400)
RBC: 4.85 MIL/uL (ref 4.22–5.81)
RDW: 13.3 % (ref 11.5–15.5)
WBC: 12.3 10*3/uL — ABNORMAL HIGH (ref 4.0–10.5)

## 2017-08-03 LAB — PSA: Prostatic Specific Antigen: 0.62 ng/mL (ref 0.00–4.00)

## 2017-08-03 LAB — PARATHYROID HORMONE, INTACT (NO CA): PTH: 79 pg/mL — ABNORMAL HIGH (ref 15–65)

## 2017-08-03 MED ORDER — KETOROLAC TROMETHAMINE 15 MG/ML IJ SOLN
15.0000 mg | Freq: Four times a day (QID) | INTRAMUSCULAR | Status: AC
  Administered 2017-08-03 – 2017-08-05 (×3): 15 mg via INTRAVENOUS
  Filled 2017-08-03 (×4): qty 1

## 2017-08-03 NOTE — Progress Notes (Signed)
23 ML Hydromorphone wasted with Virgie Dad, RN.  Lucius Conn, RN

## 2017-08-03 NOTE — Progress Notes (Signed)
Pt has order for BIPAP and is currently sitting in chair speaking on the phone/ Vitals are stable 73HR, 91% SPO2 on Room air, RR 20. RT to cont to monitor.

## 2017-08-03 NOTE — Anesthesia Preprocedure Evaluation (Signed)
Anesthesia Evaluation  Patient identified by MRN, date of birth, ID band Patient awake    Reviewed: Allergy & Precautions, NPO status , Patient's Chart, lab work & pertinent test results  History of Anesthesia Complications Negative for: history of anesthetic complications  Airway Mallampati: III  TM Distance: >3 FB Neck ROM: Full    Dental  (+) Teeth Intact   Pulmonary former smoker,  Right ptx from bleb    + decreased breath sounds      Cardiovascular negative cardio ROS   Rhythm:Regular Rate:Normal     Neuro/Psych negative neurological ROS  negative psych ROS   GI/Hepatic negative GI ROS, Neg liver ROS,   Endo/Other  negative endocrine ROS  Renal/GU negative Renal ROS     Musculoskeletal  (+) Arthritis ,   Abdominal   Peds  Hematology negative hematology ROS (+)   Anesthesia Other Findings   Reproductive/Obstetrics                             Anesthesia Physical Anesthesia Plan  ASA: II  Anesthesia Plan: General   Post-op Pain Management:    Induction: Intravenous  PONV Risk Score and Plan: 2 and Ondansetron and Dexamethasone  Airway Management Planned: Double Lumen EBT  Additional Equipment: Arterial line  Intra-op Plan:   Post-operative Plan: Extubation in OR  Informed Consent: I have reviewed the patients History and Physical, chart, labs and discussed the procedure including the risks, benefits and alternatives for the proposed anesthesia with the patient or authorized representative who has indicated his/her understanding and acceptance.   Dental advisory given  Plan Discussed with: CRNA and Surgeon  Anesthesia Plan Comments:         Anesthesia Quick Evaluation

## 2017-08-03 NOTE — Anesthesia Postprocedure Evaluation (Signed)
Anesthesia Post Note  Patient: [name]  Procedure(s) Performed: VIDEO ASSISTED THORACOSCOPY WITH BLEB RESECTION (Right Chest)     Patient location during evaluation: PACU Anesthesia Type: General Level of consciousness: awake and alert Pain management: pain level controlled Vital Signs Assessment: post-procedure vital signs reviewed and stable Respiratory status: spontaneous breathing, nonlabored ventilation, respiratory function stable and patient connected to nasal cannula oxygen Cardiovascular status: blood pressure returned to baseline and stable Postop Assessment: no apparent nausea or vomiting Anesthetic complications: no    Last Vitals:  Vitals:   08/03/17 0827 08/03/17 0900  BP:  113/86  Pulse: 63 67  Resp: 13 20  Temp:    SpO2: 93% 93%    Last Pain:  Vitals:   08/03/17 0942  TempSrc:   PainSc: 0-No pain                 Jakylan Ron

## 2017-08-03 NOTE — Progress Notes (Signed)
Patient ID: Cory Willis, male   DOB: 11/05/1950, 66 y.o.   MRN: [id number] [facility] Evening Rounds:  Hemodynamically stable  Sats 94% on RA  Good urine output  Minimal chest tube output.

## 2017-08-03 NOTE — Progress Notes (Signed)
Pt ambulated 370 feet independently on room air. Pt tolerated walking very will. Will continue to monitor closely.  Lucius Conn, RN

## 2017-08-03 NOTE — Plan of Care (Signed)
Pt and this RN discussed the purpose of Qshift head to toe assessments and how it relates to his daily care.

## 2017-08-03 NOTE — Progress Notes (Signed)
1 Day Post-Op Procedure(s) (LRB): VIDEO ASSISTED THORACOSCOPY WITH BLEB RESECTION (Right) Subjective: No complaints  Has been getting Ultram for pain. PCA stopped last pm due to over-sedation.  Objective: Vital signs in last 24 hours: Temp:  [97 F (36.1 C)-98.2 F (36.8 C)] 97 F (36.1 C) (12/29 0700) Pulse Rate:  [57-99] 68 (12/29 1000) Cardiac Rhythm: Normal sinus rhythm (12/29 0800) Resp:  [10-27] 19 (12/29 1000) BP: (107-152)/(63-86) 126/71 (12/29 1000) SpO2:  [90 %-98 %] 93 % (12/29 1000) Arterial Line BP: (85-205)/(50-92) 154/63 (12/29 1000) Weight:  [79.8 kg (175 lb 14.8 oz)] 79.8 kg (175 lb 14.8 oz) (12/28 1207)  Hemodynamic parameters for last 24 hours:    Intake/Output from previous day: 12/28 0701 - 12/29 0700 In: 2450 [I.V.:2400; IV Piggyback:50] Out: 7824 [Urine:1665; Blood:50; Chest Tube:140] Intake/Output this shift: Total I/O In: 660 [P.O.:360; I.V.:300] Out: 350 [Urine:350]  General appearance: alert and cooperative Heart: regular rate and rhythm, S1, S2 normal, no murmur, click, rub or gallop Lungs: clear to auscultation bilaterally chest tube with small intermittent air leak  Lab Results: Recent Labs    08/01/17 1038 08/03/17 0540  WBC 8.7 12.3*  HGB 16.3 14.9  HCT 47.7 44.1  PLT 322 328   BMET:  Recent Labs    08/01/17 1038 08/03/17 0540  NA 136 133*  K 4.7 4.2  CL 104 98*  CO2 27 27  GLUCOSE 99 136*  BUN 12 11  CREATININE 0.72 0.63  CALCIUM 11.5* 10.4*    PT/INR:  Recent Labs    08/01/17 1038  LABPROT 13.8  INR 1.07   ABG    Component Value Date/Time   PHART 7.396 08/03/2017 0520   HCO3 28.2 (H) 08/03/2017 0520   TCO2 28 08/02/2017 2149   ACIDBASEDEF 1.0 08/02/2017 2149   O2SAT 98.0 08/03/2017 0520   CBG (last 3)  No results for input(s): GLUCAP in the last 72 hours.  CXR: tiny right apical ptx.  Assessment/Plan: S/P Procedure(s) (LRB): VIDEO ASSISTED THORACOSCOPY WITH BLEB RESECTION (Right)  Doing well POD  1  Continue chest tube to suction when not ambulatory  DC arterial line and foley  Mobilize and IS.   LOS: 5 days    Cory Willis 08/03/2017

## 2017-08-04 ENCOUNTER — Encounter (HOSPITAL_COMMUNITY): Payer: Self-pay | Admitting: Cardiothoracic Surgery

## 2017-08-04 ENCOUNTER — Inpatient Hospital Stay (HOSPITAL_COMMUNITY): Payer: Medicare HMO

## 2017-08-04 LAB — COMPREHENSIVE METABOLIC PANEL
ALBUMIN: 3.2 g/dL — AB (ref 3.5–5.0)
ALT: 17 U/L (ref 17–63)
AST: 23 U/L (ref 15–41)
Alkaline Phosphatase: 53 U/L (ref 38–126)
Anion gap: 7 (ref 5–15)
BUN: 15 mg/dL (ref 6–20)
CHLORIDE: 100 mmol/L — AB (ref 101–111)
CO2: 26 mmol/L (ref 22–32)
Calcium: 10.3 mg/dL (ref 8.9–10.3)
Creatinine, Ser: 0.76 mg/dL (ref 0.61–1.24)
GFR calc Af Amer: >60 mL/min (ref >60)
GFR calc non Af Amer: >60 mL/min (ref >60)
GLUCOSE: 106 mg/dL — AB (ref 65–99)
POTASSIUM: 4.1 mmol/L (ref 3.5–5.1)
Sodium: 133 mmol/L — ABNORMAL LOW (ref 135–145)
Total Bilirubin: 0.6 mg/dL (ref 0.3–1.2)
Total Protein: 6.3 g/dL — ABNORMAL LOW (ref 6.5–8.1)

## 2017-08-04 LAB — CBC
HEMATOCRIT: 41.4 % (ref 39.0–52.0)
Hemoglobin: 13.9 g/dL (ref 13.0–17.0)
MCH: 30.8 pg (ref 26.0–34.0)
MCHC: 33.6 g/dL (ref 30.0–36.0)
MCV: 91.6 fL (ref 78.0–100.0)
PLATELETS: 287 10*3/uL (ref 150–400)
RBC: 4.52 MIL/uL (ref 4.22–5.81)
RDW: 13.3 % (ref 11.5–15.5)
WBC: 11.5 10*3/uL — AB (ref 4.0–10.5)

## 2017-08-04 MED ORDER — SODIUM CHLORIDE 0.9% FLUSH
3.0000 mL | Freq: Two times a day (BID) | INTRAVENOUS | Status: DC
  Administered 2017-08-04: 9 mL via INTRAVENOUS
  Administered 2017-08-05 (×2): 3 mL via INTRAVENOUS

## 2017-08-04 MED ORDER — SODIUM CHLORIDE 0.9% FLUSH: 3.0000 mL | INTRAVENOUS | Status: DC | PRN

## 2017-08-04 NOTE — Progress Notes (Signed)
Patient ID: Cory Willis, male   DOB: 24-Aug-1950, 66 y.o.   MRN: [id number] [facility] Evening Rounds:  Hemodynamically stable  No air leak from chest tube.  Ambulating well

## 2017-08-04 NOTE — Progress Notes (Signed)
2 Days Post-Op Procedure(s) (LRB): VIDEO ASSISTED THORACOSCOPY WITH BLEB RESECTION (Right) Subjective:  No complaints. Walking well   Objective: Vital signs in last 24 hours: Temp:  [97.2 F (36.2 C)-97.8 F (36.6 C)] 97.2 F (36.2 C) (12/30 0700) Pulse Rate:  [59-77] 69 (12/30 1001) Cardiac Rhythm: Normal sinus rhythm (12/30 0400) Resp:  [14-30] 24 (12/30 1001) BP: (98-139)/(60-109) 122/72 (12/30 1000) SpO2:  [88 %-97 %] 95 % (12/30 1001) Weight:  [77.6 kg (171 lb)] 77.6 kg (171 lb) (12/30 0600)  Hemodynamic parameters for last 24 hours:    Intake/Output from previous day: 12/29 0701 - 12/30 0700 In: 1000 [P.O.:700; I.V.:300] Out: 2210 [Urine:2150; Chest Tube:60] Intake/Output this shift: No intake/output data recorded.  General appearance: alert and cooperative Heart: regular rate and rhythm, S1, S2 normal, no murmur, click, rub or gallop Lungs: clear to auscultation bilaterally no air leak from chest tube  Lab Results: Recent Labs    08/03/17 0540 08/04/17 0214  WBC 12.3* 11.5*  HGB 14.9 13.9  HCT 44.1 41.4  PLT 328 287   BMET:  Recent Labs    08/03/17 0540 08/04/17 0214  NA 133* 133*  K 4.2 4.1  CL 98* 100*  CO2 27 26  GLUCOSE 136* 106*  BUN 11 15  CREATININE 0.63 0.76  CALCIUM 10.4* 10.3    PT/INR: No results for input(s): LABPROT, INR in the last 72 hours. ABG    Component Value Date/Time   PHART 7.396 08/03/2017 0520   HCO3 28.2 (H) 08/03/2017 0520   TCO2 28 08/02/2017 2149   ACIDBASEDEF 1.0 08/02/2017 2149   O2SAT 98.0 08/03/2017 0520   CBG (last 3)  No results for input(s): GLUCAP in the last 72 hours.  CXR: tiny residual right ptx  Assessment/Plan: S/P Procedure(s) (LRB): VIDEO ASSISTED THORACOSCOPY WITH BLEB RESECTION (Right)  Air leak resolved and no significant tidaling. Will put to water seal and repeat CXR in am. Tube will probably be able to be removed tomorrow.  Continue ambulation and IS.   LOS: 6 days    Gaye Pollack 08/04/2017

## 2017-08-04 NOTE — Progress Notes (Signed)
Pt ambulated 540 ft around unit on room air. Pt tolerated very well. Pt 96% on room air. VSS. Now resting in recliner. Will continue to monitor closely.  Lucius Conn, RN

## 2017-08-05 ENCOUNTER — Inpatient Hospital Stay (HOSPITAL_COMMUNITY): Payer: Medicare HMO

## 2017-08-05 DIAGNOSIS — Z09 Encounter for follow-up examination after completed treatment for conditions other than malignant neoplasm: Secondary | ICD-10-CM

## 2017-08-05 LAB — TYPE AND SCREEN
ABO/RH(D): O NEG
Antibody Screen: NEGATIVE
Unit division: 0
Unit division: 0

## 2017-08-05 LAB — BPAM RBC
Blood Product Expiration Date: 201901092359
Blood Product Expiration Date: 201901102359
ISSUE DATE / TIME: 201812260940
ISSUE DATE / TIME: 201812261833
Unit Type and Rh: 9500
Unit Type and Rh: 9500

## 2017-08-05 MED ORDER — ENOXAPARIN SODIUM 40 MG/0.4ML ~~LOC~~ SOLN
40.0000 mg | SUBCUTANEOUS | Status: DC
  Administered 2017-08-05 – 2017-08-06 (×2): 40 mg via SUBCUTANEOUS
  Filled 2017-08-05 (×2): qty 0.4

## 2017-08-05 NOTE — Discharge Summary (Signed)
Physician Discharge Summary  Patient ID: Cory Willis MRN: [id number] DOB/AGE: [birth year] 66 y.o.  Admit date: 07/29/2017 Discharge date: 08/06/2017  Admission Diagnoses:  Patient Active Problem List   Diagnosis Date Noted  . Pneumothorax 07/29/2017  . Malignant neoplasm of prostate ([place]) 01/27/2015  . Increased prostate specific antigen (PSA) velocity 12/06/2014  . Hyperlipidemia 12/06/2014  . Wellness examination 08/26/2014  . Cataract 08/19/2014  . History of alcoholism ([place]) 08/19/2014  . Inguinal hernia 08/19/2014   Discharge Diagnoses:   Patient Active Problem List   Diagnosis Date Noted  . S/P lung surgery, follow-up exam 08/05/2017  . Pneumothorax 07/29/2017  . Malignant neoplasm of prostate ([place]) 01/27/2015  . Increased prostate specific antigen (PSA) velocity 12/06/2014  . Hyperlipidemia 12/06/2014  . Wellness examination 08/26/2014  . Cataract 08/19/2014  . History of alcoholism ([place]) 08/19/2014  . Inguinal hernia 08/19/2014   Discharged Condition: good  History of Present Illness:  Cory Willis is a 66 yo white male with history of nicotine abuse.  He presented to the ED on 07/29/2017 with complaints of right sided chest pain, shortness of breath.  This developed early morning upon awakening.  He presented to the ED where CT scan was obtained and showed a large bleb at the right apex and a large pneumothorax.  He denied any history of previous pneumothorax or chest trauma.  He required chest tube placement and was admitted to the hospital for further care.  Hospital Course:   The patient remained clinically stable.  However, despite chest tube placement he had continued air leak from his chest tube.   It was felt the patient would require VATs procedure.  The risks and benefits of the procedure were explained to the patient and he was agreeable to proceed.  He was taken to the operating room on 07/31/2017.  He underwent Right VATS with Mini Thoracotomy with resection of  right apical bleb and mechanical pleurodesis.  He tolerated the procedure without difficulty, was extubated and taken to the SICU in stable condition.  The patient did well post operatively.  His chest tube initially had an air leak.  CXR showed a tiny apical pneumothorax.  The patient's air leak resolved by POD #2.  CXR showed no evidence of pneumothorax.  His chest tube was transitioned to water seal.  Follow up CXR showed stable appearance of CXR.  There was again no evidence of pneumothorax.  His chest tube was removed on 08/05/2017.  He tolerated this without difficulty.  Follow up CXR showed no evidence of pneumothorax.  He is ambulating without difficulty.  His pain is well controlled.  He is felt medically stable for discharge home today.    Significant Diagnostic Studies: radiology:   CT scan:  Large right side pneumothorax, 40-50%. Areas of atelectasis in a right upper lobe. Large bleb noted in the right upper lobe/ apex.  Treatments: surgery:    VIDEO ASSISTED THORACOSCOPY/MINI THORACOTOMY -Resection Right Apical Bleb -Mechanical Pleurodesis  Disposition: 01-Home or Self Care   Discharge Medications:   Allergies as of 08/06/2017   No Known Allergies     Medication List    STOP taking these medications   clindamycin 150 MG capsule Commonly known as:  CLEOCIN   HYDROcodone-acetaminophen 5-325 MG tablet Commonly known as:  NORCO     TAKE these medications   acetaminophen 500 MG tablet Commonly known as:  TYLENOL Take 500 mg by mouth daily as needed for headache.   aspirin 81 MG EC tablet Take  1 tablet (81 mg total) by mouth daily.   chlorpheniramine 4 MG tablet Commonly known as:  CHLOR-TRIMETON Take 4 mg by mouth every 6 (six) hours as needed for allergies.   simvastatin 20 MG tablet Commonly known as:  ZOCOR Take 1 tablet (20 mg total) by mouth at bedtime.   tamsulosin 0.4 MG Caps capsule Commonly known as:  FLOMAX Take 1 capsule (0.4 mg total) by mouth  daily.   traMADol 50 MG tablet Commonly known as:  ULTRAM Take 1 tablet (50 mg total) by mouth every 6 (six) hours as needed (mild pain).      Follow-up Information    Prescott Gum, Collier [place], MD Follow up in 2 week(s).   Specialty:  Cardiothoracic Surgery Why:  Office will contact you with appointment date and time... Please get CXR 30 min prior to your appointment with Dr. Prescott Gum at [facility] located on first floor of our office building Contact information: [place] [phone]           Signed: Ellwood Handler 08/06/2017, 8:42 AM

## 2017-08-05 NOTE — Progress Notes (Signed)
Patient ID: Cory Willis, male   DOB: 10/07/1950, 66 y.o.   MRN: [id number] EVENING ROUNDS NOTE :     [address]       [place] [address]             [phone]                 3 Days Post-Op Procedure(s) (LRB): VIDEO ASSISTED THORACOSCOPY WITH BLEB RESECTION (Right)  Total Length of Stay:  LOS: 7 days  BP 123/78   Pulse 71   Temp 97.6 F (36.4 C) (Oral)   Resp 14   Ht 5' 7.99" (1.727 m)   Wt 171 lb (77.6 kg)   SpO2 93%   BMI 26.01 kg/m   .Intake/Output      12/30 0701 - 12/31 0700 12/31 0701 - 01/01 0700   P.O. 1160 240   I.V. (mL/kg)     IV Piggyback     Total Intake(mL/kg) 1160 (14.9) 240 (3.1)   Urine (mL/kg/hr) 1675 (0.9)    Chest Tube 40 0   Total Output 1715 0   Net -555 +240          . potassium chloride       Lab Results  Component Value Date   WBC 11.5 (H) 08/04/2017   HGB 13.9 08/04/2017   HCT 41.4 08/04/2017   PLT 287 08/04/2017   GLUCOSE 106 (H) 08/04/2017   CHOL 178 12/06/2014   TRIG 82.0 12/06/2014   HDL 50.10 12/06/2014   LDLCALC 112 (H) 12/06/2014   ALT 17 08/04/2017   AST 23 08/04/2017   NA 133 (L) 08/04/2017   K 4.1 08/04/2017   CL 100 (L) 08/04/2017   CREATININE 0.76 08/04/2017   BUN 15 08/04/2017   CO2 26 08/04/2017   PSA 6.22 (H) 08/26/2014   INR 1.07 08/01/2017   Dg Chest Port 1 View  Result Date: 08/05/2017 CLINICAL DATA:  Shortness of breath.  Recent pneumothorax EXAM: PORTABLE CHEST 1 VIEW COMPARISON:  August 04, 2017 FINDINGS: Chest tube remains on the right without appreciable pneumothorax. There is mild bibasilar atelectasis. There is no edema or consolidation. Heart size and pulmonary vascularity are normal. No adenopathy. There is aortic atherosclerosis. No evident bone lesions. IMPRESSION: No appreciable pneumothorax with chest tube in place. Mild bibasilar atelectasis. No edema or consolidation. Stable cardiac silhouette. There is aortic atherosclerosis. Aortic Atherosclerosis (ICD10-I70.0).  Electronically Signed   By: Lowella Grip III M.D.   On: 08/05/2017 07:35   Chest tube now out, follow up PA & Lat chest xray in am   [name] MD  Beeper [phone] Office [phone] 08/05/2017 3:28 PM

## 2017-08-05 NOTE — Discharge Instructions (Signed)
Video-Assisted Thoracic Surgery, Care After ° °This sheet gives you information about how to care for yourself after your procedure. Your health care provider may also give you more specific instructions. If you have problems or questions, contact your health care provider. °What can I expect after the procedure? °After the procedure, it is common to have: °· Some pain and soreness in your chest. °· Pain when breathing in (inhaling) and coughing. °· Constipation. °· Fatigue. °· Difficulty sleeping. ° °Follow these instructions at home: °Preventing pneumonia °· Take deep breaths or do breathing exercises as instructed by your health care provider. Doing this helps prevent lung infection (pneumonia). °· Cough frequently. Coughing may cause discomfort, but it is important to clear mucus (phlegm) and expand your lungs. If it hurts to cough, hold a pillow against your chest or place the palms of both hands on top of the incision (use splinting) when you cough. This may help relieve discomfort. °· If you were given an incentive spirometer, use it as directed. An incentive spirometer is a tool that measures how well you are filling your lungs with each breath. °· Participate in pulmonary rehabilitation as directed by your health care provider. This is a program that combines education, exercise, and support from a team of specialists. The goal is to help you heal and get back to your normal activities as soon as possible. °Medicines °· Take over-the-counter or prescription medicines only as told by your health care provider. °· If you have pain, take pain-relieving medicine before your pain becomes severe. This is important because if your pain is under control, you will be able to breathe and cough more comfortably. °· If you were prescribed an antibiotic medicine, take it as told by your health care provider. Do not stop taking the antibiotic even if you start to feel better. °Activity °· Ask your health care provider  what activities are safe for you. °· Avoid activities that use your chest muscles for at least 3-4 weeks. °· Do not lift anything that is heavier than 10 lb (4.5 kg), or the limit that your health care provider tells you, until he or she says that it is safe. °Incision care °· Follow instructions from your health care provider about how to take care of your incision(s). Make sure you: °? Wash your hands with soap and water before you change your bandage (dressing). If soap and water are not available, use hand sanitizer. °? Change your dressing as told by your health care provider. °? Leave stitches (sutures), skin glue, or adhesive strips in place. These skin closures may need to stay in place for 2 weeks or longer. If adhesive strip edges start to loosen and curl up, you may trim the loose edges. Do not remove adhesive strips completely unless your health care provider tells you to do that. °· Keep your dressing dry until it has been removed. °· Check your incision area every day for signs of infection. Check for: °? Redness, swelling, or pain. °? Fluid or blood. °? Warmth. °? Pus or a bad smell. °Bathing °· Do not take baths, swim, or use a hot tub until your health care provider approves. You may take showers. °· After your dressing has been removed, use soap and water to gently wash your incision area. Do not use anything else to clean your incision(s) unless your health care provider tells you to do this. °Driving °· Do not drive until your health care provider approves. °· Do not drive   or use heavy machinery while taking prescription pain medicine. °Eating and drinking °· Eat a healthy, balanced diet as instructed by your health care provider. A healthy diet includes plenty of fresh fruits and vegetables, whole grains, and low-fat (lean) proteins. °· Limit foods that are high in fat and processed sugars, such as fried and sweet foods. °· Drink enough fluid to keep your urine clear or pale yellow. °General  instructions °· To prevent or treat constipation while you are taking prescription pain medicine, your health care provider may recommend that you: °? Take over-the-counter or prescription medicines. °? Eat foods that are high in fiber, such as beans, fresh fruits and vegetables, and whole grains. °· Do not use any products that contain nicotine or tobacco, such as cigarettes and e-cigarettes. If you need help quitting, ask your health care provider. °· Avoid secondhand smoke. °· Wear compression stockings as told by your health care provider. These stockings help to prevent blood clots and reduce swelling in your legs. °· If you have a chest tube, care for it as instructed by your health care provider. Do not travel by airplane during the 2 weeks after your chest tube is removed, or until your health care provider says that this is safe. °· Keep all follow-up visits as told by your health care provider. This is important. °Contact a health care provider if: °· You have redness, swelling, or pain around an incision. °· You have fluid or blood coming from an incision. °· Your incision area feels warm to the touch. °· You have pus or a bad smell coming from an incision. °· You have a fever or chills. °· You have nausea or vomiting. °· You have pain that does not get better with medicine. °Get help right away if: °· You have chest pain. °· Your heart is fluttering or beating rapidly. °· You develop a rash. °· You have shortness of breath or trouble breathing. °· You are confused. °· You have trouble speaking. °· You feel weak, light-headed, or dizzy. °· You faint. °Summary °· To help prevent lung infection (pneumonia), take deep breaths or do breathing exercises as instructed by your health care provider. °· Cough frequently to clear mucus (phlegm) and expand your lungs. If it hurts to cough, hold a pillow against your chest or place the palms of both hands on top of the incision (use splinting) when you cough. °· If  you have pain, take pain-relieving medicine before your pain becomes severe. This is important because if your pain is under control, you will be able to breathe and cough more comfortably. °· Ask your health care provider what activities are safe for you. °This information is not intended to replace advice given to you by your health care provider. Make sure you discuss any questions you have with your health care provider. °Document Released: 11/17/2012 Document Revised: 07/02/2016 Document Reviewed: 07/02/2016 °Elsevier Interactive Patient Education © 2017 [employer] ° °

## 2017-08-05 NOTE — Progress Notes (Addendum)
[facility] DAILY ICU PROGRESS NOTE                   [address]            [place] [address]          [phone]   3 Days Post-Op Procedure(s) (LRB): VIDEO ASSISTED THORACOSCOPY WITH BLEB RESECTION (Right)  Total Length of Stay:  LOS: 7 days   Subjective:  No new complaints.  Continues to have incisional discomfort.  Wants to go home.  Objective: Vital signs in last 24 hours: Temp:  [97.7 F (36.5 C)-98.4 F (36.9 C)] 98.1 F (36.7 C) (12/31 0300) Pulse Rate:  [56-85] 67 (12/31 0600) Cardiac Rhythm: Normal sinus rhythm (12/31 0616) Resp:  [12-29] 22 (12/31 0600) BP: (95-138)/(46-80) 121/80 (12/31 0600) SpO2:  [83 %-97 %] 92 % (12/31 0600)  Filed Weights   07/31/17 0421 08/02/17 1207 08/04/17 0600  Weight: 175 lb 14.8 oz (79.8 kg) 175 lb 14.8 oz (79.8 kg) 171 lb (77.6 kg)    Weight change:    Intake/Output from previous day: 12/30 0701 - 12/31 0700 In: 1160 [P.O.:1160] Out: 1715 [Urine:1675; Chest Tube:40]  Current Meds: Scheduled Meds: . acetaminophen  1,000 mg Oral Q6H   Or  . acetaminophen (TYLENOL) oral liquid 160 mg/5 mL  1,000 mg Oral Q6H  . aspirin EC  81 mg Oral Daily  . bisacodyl  10 mg Oral Daily  . ketorolac  15 mg Intravenous Q6H  . senna-docusate  1 tablet Oral QHS  . sodium chloride flush  3 mL Intravenous Q12H   Continuous Infusions: . potassium chloride     PRN Meds:.levalbuterol, oxyCODONE, potassium chloride, sodium chloride flush, traMADol  General appearance: alert, cooperative and no distress Heart: regular rate and rhythm Lungs: clear to auscultation bilaterally Abdomen: soft, non-tender; bowel sounds normal; no masses,  no organomegaly Extremities: extremities normal, atraumatic, no cyanosis or edema Wound: clean and dry  Lab Results: CBC: Recent Labs    08/03/17 0540 08/04/17 0214  WBC 12.3* 11.5*  HGB 14.9 13.9  HCT 44.1 41.4  PLT 328 287   BMET:  Recent Labs    08/03/17 0540 08/04/17 0214  NA 133*  133*  K 4.2 4.1  CL 98* 100*  CO2 27 26  GLUCOSE 136* 106*  BUN 11 15  CREATININE 0.63 0.76  CALCIUM 10.4* 10.3    CMET: Lab Results  Component Value Date   WBC 11.5 (H) 08/04/2017   HGB 13.9 08/04/2017   HCT 41.4 08/04/2017   PLT 287 08/04/2017   GLUCOSE 106 (H) 08/04/2017   CHOL 178 12/06/2014   TRIG 82.0 12/06/2014   HDL 50.10 12/06/2014   LDLCALC 112 (H) 12/06/2014   ALT 17 08/04/2017   AST 23 08/04/2017   NA 133 (L) 08/04/2017   K 4.1 08/04/2017   CL 100 (L) 08/04/2017   CREATININE 0.76 08/04/2017   BUN 15 08/04/2017   CO2 26 08/04/2017   PSA 6.22 (H) 08/26/2014   INR 1.07 08/01/2017      PT/INR: No results for input(s): LABPROT, INR in the last 72 hours. Radiology: Dg Chest Port 1 View  Result Date: 08/05/2017 CLINICAL DATA:  Shortness of breath.  Recent pneumothorax EXAM: PORTABLE CHEST 1 VIEW COMPARISON:  August 04, 2017 FINDINGS: Chest tube remains on the right without appreciable pneumothorax. There is mild bibasilar atelectasis. There is no edema or consolidation. Heart size and pulmonary vascularity are normal. No adenopathy. There is  aortic atherosclerosis. No evident bone lesions. IMPRESSION: No appreciable pneumothorax with chest tube in place. Mild bibasilar atelectasis. No edema or consolidation. Stable cardiac silhouette. There is aortic atherosclerosis. Aortic Atherosclerosis (ICD10-I70.0). Electronically Signed   By: Lowella Grip III M.D.   On: 08/05/2017 07:35     Assessment/Plan: S/P Procedure(s) (LRB): VIDEO ASSISTED THORACOSCOPY WITH BLEB RESECTION (Right)  1. Chest tube- no air leak, output is minimal- d/c chest tube today 2. pulm- stable appearance of CXR, no pneumothorax, repeat tomorrow AM 3. CV- hemodynamically stable 4. Start Lovenox for DVT prophylaxis 5. Dispo- patient stable, d/c chest tube today, transfer to telemetry unit, home tomorrow if CXR remains stable     Ellwood Handler 08/05/2017 8:22 AM   Patient examined and  CXR image reviewed patient examined and medical record reviewed,agree with above note. Tharon Aquas Trigt III 08/05/2017

## 2017-08-05 NOTE — Care Management Note (Signed)
Case Management Note Marvetta Gibbons RN, BSN Unit 4E-Case Manager-- [place] coverage [phone]  Patient Details  Name: Marcos Ruelas MRN: [id number] Date of Birth: [birth year]  Subjective/Objective:  Pt admitted s/p VATS               Action/Plan: PTA pt lived at home- anticipate return home- CM to follow  Expected Discharge Date:                 Expected Discharge Plan:  Home/Self Care  In-House Referral:     Discharge planning Services  CM Consult  Post Acute Care Choice:    Choice offered to:     DME Arranged:    DME Agency:     HH Arranged:    [place] Agency:     Status of Service:  In process, will continue to follow  If discussed at Long Length of Stay Meetings, dates discussed:    Discharge Disposition:   Additional Comments:  Dawayne Patricia, RN 08/05/2017, 10:43 AM

## 2017-08-06 ENCOUNTER — Inpatient Hospital Stay (HOSPITAL_COMMUNITY): Payer: Medicare HMO

## 2017-08-06 MED ORDER — ASPIRIN 81 MG PO TBEC: 81.0000 mg | DELAYED_RELEASE_TABLET | Freq: Every day | ORAL | Status: AC

## 2017-08-06 MED ORDER — TRAMADOL HCL 50 MG PO TABS: 50.0000 mg | ORAL_TABLET | Freq: Four times a day (QID) | ORAL | 0 refills | Status: DC | PRN

## 2017-08-06 NOTE — Plan of Care (Signed)
Pt and RN continued to discusses ways to prevent falls. Pt verbalized an understanding of when to use the call bell when he wants to ambulate.

## 2017-08-06 NOTE — Plan of Care (Signed)
Discharge instructions, including new medications, breathing exercises, incisional care, and follow-up appointment information, reviewed with pt. Script for Tramadol given to patient, as well as packet of discharge instructions. Pt demonstrates understanding of instructions. Pt will be delivered to his car, which is at [employer], via [facility], per [place].

## 2017-08-06 NOTE — Op Note (Signed)
NAMERESHAWN, OSTLUND NO.:  [id number]  MEDICAL RECORD NO.:  [id number]  LOCATION:  WA17                         FACILITY:  [facility]  PHYSICIAN:  Ivin Poot, M.D.  DATE OF BIRTH:  06-Jan-1951  DATE OF PROCEDURE:  08/02/2017 DATE OF DISCHARGE:                              OPERATIVE REPORT   OPERATIONS: 1. Right VATS (video-assisted thoracoscopic surgery) for resection of     right upper lobe bleb. 2. Mechanical pleurodesis. 3. Placement of right chest tube.  SURGEON:  Ivin Poot, MD.  ASSISTANTEllwood Handler, PA-C.  PREOPERATIVE DIAGNOSIS:  Spontaneous right pneumothorax with persistent air leak from a large right apical bleb.  ANESTHESIA:  General.  INDICATIONS:  The patient is a 67 year old Caucasian male, ex-smoker, who presented to the hospital emergency room with shortness of breath, chest pain, and a large right pneumothorax.  Interventional Radiology placed a pigtail catheter because of a large bleb in the proximity of the pneumothorax.  This significantly reduced the pneumothorax, however, there was a persistent air leak over several days.  Because of the inability of the chest tube to resolve the air leak and pneumothorax, a right VATS procedure for bleb resection and pleurodesis was recommended. I discussed the procedure in detail with the patient including the use of general anesthesia, the location of the surgical incisions, and the expected postoperative hospital recovery.  I discussed the risks of persistent and recurrent pneumothorax, infection, bleeding, and death. The patient demonstrated understanding and agreed to proceed with surgery under what I felt was an informed consent.  OPERATIVE FINDINGS: 1. There was a large apical bleb, which was the source of the air     leak, which was excised with endovascular stapling device. 2. Adhesions of the right lung to the chest wall precluded a full VATS     procedure and a small  thoracotomy incision was made in order to     provide access to remove the bleb.  DESCRIPTION OF PROCEDURE:  The patient was brought to the operating room and placed supine on the operating table.  General anesthesia was induced under invasive hemodynamic monitoring.  The patient was intubated with a double-lumen endotracheal tube and positioned right side up.  The right chest was prepped and draped as a sterile field.  A proper time-out was performed.  A small VATS incision was made at the tip of the scapula in the anterior axillary line at the fifth interspace.  The camera was inserted.  The lung was adherent to the area of the VATS incision at the scapular tip.  The camera was inserted in the anterior incision.  This too was obscured with adhesions between the pleura and lung.  A small incision was made posterior to the scapula and the fifth interspace and a space was noted.  Using careful dissection and enlarging the anterior incision, the lung was swept away to expose access to the apical bleb.  Using VATS instruments and endovascular stapling devices, a wedge resection of the right upper lobe was performed to completely remove the bleb.  A mechanical pleurodesis was then performed by using the cautery scratch pad to abrade the  parietal pleura in the right pleural space.  After the pleurodesis, a 28-French chest tube was inserted in the original VATS incision and extended to the apex and secured to the skin.  This was connected to an underwater seal Pleur-evac drainage system.  Under direct exposure, the lung was re-expanded.  The incisions were closed with interrupted Vicryl and a running subcuticular suture for the skin.  Sterile dressings were applied.  The patient was rolled supine, extubated, returned to recovery room in stable condition.  Chest x-ray showed resolution of the pneumothorax.     Ivin Poot, M.D.     PV/MEDQ  D:  08/05/2017  T:  08/06/2017  Job:   [id number]

## 2017-08-06 NOTE — Progress Notes (Signed)
Patient ID: Cory Willis, male   DOB: 07/11/1951, 67 y.o.   MRN: [id number] TCTS DAILY ICU PROGRESS NOTE                   [address]            [place] [address]          [phone]   4 Days Post-Op Procedure(s) (LRB): VIDEO ASSISTED THORACOSCOPY WITH BLEB RESECTION (Right)  Total Length of Stay:  LOS: 8 days   Subjective: Patient feels well this morning, wants to go home, breathing well  Objective: Vital signs in last 24 hours: Temp:  [97.5 F (36.4 C)-98.3 F (36.8 C)] 98.3 F (36.8 C) (01/01 0400) Pulse Rate:  [58-101] 62 (01/01 0500) Cardiac Rhythm: Normal sinus rhythm (01/01 0400) Resp:  [14-30] 23 (01/01 0500) BP: (109-135)/(67-105) 115/83 (01/01 0400) SpO2:  [89 %-97 %] 90 % (01/01 0500)  Filed Weights   07/31/17 0421 08/02/17 1207 08/04/17 0600  Weight: 175 lb 14.8 oz (79.8 kg) 175 lb 14.8 oz (79.8 kg) 171 lb (77.6 kg)    Weight change:    Hemodynamic parameters for last 24 hours:    Intake/Output from previous day: 12/31 0701 - 01/01 0700 In: 240 [P.O.:240] Out: 1250 [Urine:1250]  Intake/Output this shift: No intake/output data recorded.  Current Meds: Scheduled Meds: . acetaminophen  1,000 mg Oral Q6H   Or  . acetaminophen (TYLENOL) oral liquid 160 mg/5 mL  1,000 mg Oral Q6H  . aspirin EC  81 mg Oral Daily  . bisacodyl  10 mg Oral Daily  . enoxaparin (LOVENOX) injection  40 mg Subcutaneous Q24H  . senna-docusate  1 tablet Oral QHS  . sodium chloride flush  3 mL Intravenous Q12H   Continuous Infusions: . potassium chloride     PRN Meds:.levalbuterol, oxyCODONE, potassium chloride, sodium chloride flush, traMADol  General appearance: alert, cooperative and no distress Neurologic: intact Heart: regular rate and rhythm, S1, S2 normal, no murmur, click, rub or gallop Lungs: clear to auscultation bilaterally Abdomen: soft, non-tender; bowel sounds normal; no masses,  no organomegaly Extremities: extremities normal, atraumatic,  no cyanosis or edema and Homans sign is negative, no sign of DVT Wound: Incision intact  Lab Results: CBC: Recent Labs    08/04/17 0214  WBC 11.5*  HGB 13.9  HCT 41.4  PLT 287   BMET:  Recent Labs    08/04/17 0214  NA 133*  K 4.1  CL 100*  CO2 26  GLUCOSE 106*  BUN 15  CREATININE 0.76  CALCIUM 10.3    CMET: Lab Results  Component Value Date   WBC 11.5 (H) 08/04/2017   HGB 13.9 08/04/2017   HCT 41.4 08/04/2017   PLT 287 08/04/2017   GLUCOSE 106 (H) 08/04/2017   CHOL 178 12/06/2014   TRIG 82.0 12/06/2014   HDL 50.10 12/06/2014   LDLCALC 112 (H) 12/06/2014   ALT 17 08/04/2017   AST 23 08/04/2017   NA 133 (L) 08/04/2017   K 4.1 08/04/2017   CL 100 (L) 08/04/2017   CREATININE 0.76 08/04/2017   BUN 15 08/04/2017   CO2 26 08/04/2017   PSA 6.22 (H) 08/26/2014   INR 1.07 08/01/2017      PT/INR: No results for input(s): LABPROT, INR in the last 72 hours. Radiology: No results found. Chest x-ray not read yet on my review the lung is fully inflated some pleural reaction along the right mid lung field stable  Assessment/Plan: S/P  Procedure(s) (LRB): VIDEO ASSISTED THORACOSCOPY WITH BLEB RESECTION (Right) Plan for discharge: see discharge orders     Cory Willis 08/06/2017 7:50 AM

## 2017-08-20 DIAGNOSIS — H2513 Age-related nuclear cataract, bilateral: Secondary | ICD-10-CM | POA: Diagnosis not present

## 2017-08-20 DIAGNOSIS — H25013 Cortical age-related cataract, bilateral: Secondary | ICD-10-CM | POA: Diagnosis not present

## 2017-08-20 DIAGNOSIS — H52203 Unspecified astigmatism, bilateral: Secondary | ICD-10-CM | POA: Diagnosis not present

## 2017-08-20 DIAGNOSIS — H5213 Myopia, bilateral: Secondary | ICD-10-CM | POA: Diagnosis not present

## 2017-08-20 DIAGNOSIS — H524 Presbyopia: Secondary | ICD-10-CM | POA: Diagnosis not present

## 2017-08-21 ENCOUNTER — Ambulatory Visit: Payer: Medicare HMO | Admitting: Cardiothoracic Surgery

## 2017-08-22 ENCOUNTER — Other Ambulatory Visit: Payer: Self-pay | Admitting: Cardiothoracic Surgery

## 2017-08-22 DIAGNOSIS — J939 Pneumothorax, unspecified: Secondary | ICD-10-CM

## 2017-08-23 ENCOUNTER — Ambulatory Visit (INDEPENDENT_AMBULATORY_CARE_PROVIDER_SITE_OTHER): Payer: Self-pay | Admitting: Surgical

## 2017-08-23 ENCOUNTER — Ambulatory Visit
Admission: RE | Admit: 2017-08-23 | Discharge: 2017-08-23 | Disposition: A | Discharge status: Home / Self Care | Payer: Medicare HMO | Source: Ambulatory Visit | Attending: Cardiothoracic Surgery | Admitting: Cardiothoracic Surgery

## 2017-08-23 VITALS — BP 128/76 | HR 69 | Resp 20 | Ht 70.0 in | Wt 171.0 lb

## 2017-08-23 DIAGNOSIS — J939 Pneumothorax, unspecified: Secondary | ICD-10-CM

## 2017-08-23 DIAGNOSIS — Z09 Encounter for follow-up examination after completed treatment for conditions other than malignant neoplasm: Secondary | ICD-10-CM

## 2017-08-23 DIAGNOSIS — J9382 Other air leak: Secondary | ICD-10-CM

## 2017-08-23 DIAGNOSIS — IMO0002 Reserved for concepts with insufficient information to code with codable children: Secondary | ICD-10-CM

## 2017-08-23 DIAGNOSIS — J9383 Other pneumothorax: Secondary | ICD-10-CM

## 2017-08-23 NOTE — Assessment & Plan Note (Signed)
Continue clinical observation and repeat chest x-ray in 2 weeks or prior if he becomes more symptomatic

## 2017-08-23 NOTE — Patient Instructions (Signed)
The patient has been instructed on when to call the office or present to the emergency department if he becomes more symptomatic.  He is also been instructed on using his incentive spirometer more often.

## 2017-08-23 NOTE — Progress Notes (Signed)
[address]       [place] [address]             [phone]           [address]       ,[place] [address]             [phone]      Cory Willis [place] Medical Record #[id number] Date of Birth: [birth year]  Referring: Cory Leigh, MD Primary Care: System, Pcp Not In Primary Cardiologist: No primary care provider on file.   Chief Complaint:   POST OP FOLLOW UP  DATE OF PROCEDURE:  08/02/2017 DATE OF DISCHARGE:                              OPERATIVE REPORT   OPERATIONS: 1. Right VATS (video-assisted thoracoscopic surgery) for resection of     right upper lobe bleb. 2. Mechanical pleurodesis. 3. Placement of right chest tube.  SURGEON:  Cory Poot, MD.  ASSISTANTEllwood Handler, PA-C.   History of Present Illness:   The patient is a 67 year old male status post the above described procedure seen in the office on today's date and routine postsurgical follow-up.  Overall the patient says he feels quite well.  He has occasional mild shortness of breath but for the most part feels quite comfortable.  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  He is a former smoker having quit approximately 10 years ago.  Oxygen saturations are 97% on room air.  He has had no difficulties with fevers, chills or other constitutional symptoms.  He has had no difficulty with his incisions.  He has driving and doing other activities of daily living without significant pain or shortness of breath.      Past Medical History:  Diagnosis Date  . Alcoholism in recovery ([place])   . Arthritis    shoulders  . Frequency of urination   . Hyperlipidemia 12/06/2014  . Prostate cancer [facility]) urologist--  dr Cory Willis/  oncologist-- dr Cory Willis   Stage T1c, Gleason 3+4, PSA 7.4  . Wears glasses      Social History   Tobacco Use  Smoking Status Former Smoker  . Packs/day: 1.00  . Years: 40.00  .  Pack years: 40.00  . Types: Cigarettes  . Last attempt to quit: 08/20/2007  . Years since quitting: 10.0  Smokeless Tobacco Current User  . Types: Snuff    Social History   Substance and Sexual Activity  Alcohol Use No  . Alcohol/week: 0.0 oz   Comment: RECOVERING ALCOHOLIC -- QUIT    [id number]     No Known Allergies  Current Outpatient Medications  Medication Sig Dispense Refill  . acetaminophen (TYLENOL) 500 MG tablet Take 500 mg by mouth daily as needed for headache.    Marland Kitchen aspirin EC 81 MG EC tablet Take 1 tablet (81 mg total) by mouth daily.    . chlorpheniramine (CHLOR-TRIMETON) 4 MG tablet Take 4 mg by mouth every 6 (six) hours as needed for allergies.     No current facility-administered medications for this visit.        Physical Exam: BP 128/76   Pulse 69   Resp 20   Ht 5\' 10"  (1.778 m)   Wt 171 lb (77.6 kg)   SpO2 97% Comment: RA  BMI 24.54 kg/m  General appearance: alert, cooperative and no distress Heart: regular rate and rhythm Lungs: clear to auscultation bilaterally Extremities: No edema Wound: Incision well-healed without evidence of infection   Diagnostic Studies & Laboratory data:     Recent Radiology Findings:   Dg Chest 2 View  Result Date: 08/23/2017 CLINICAL DATA:  Right pneumothorax. EXAM: CHEST  2 VIEW COMPARISON:  08/06/2017 and 08/05/2017 FINDINGS: There is a recurrent right pneumothorax with apical, lateral, medial, and basal components. This is probably 10-15%. Heart size and pulmonary vascularity are normal. Left lung is clear. No bone abnormality. IMPRESSION: Recurrent complex right pneumothorax. Electronically Signed   By: Lorriane Shire M.D.   On: 08/23/2017 10:47      Recent Lab Findings: Lab Results  Component Value Date   WBC 11.5 (H) 08/04/2017   HGB 13.9 08/04/2017   HCT 41.4 08/04/2017   PLT 287 08/04/2017   GLUCOSE 106 (H) 08/04/2017   CHOL 178 12/06/2014   TRIG 82.0 12/06/2014   HDL 50.10 12/06/2014   LDLCALC  112 (H) 12/06/2014   ALT 17 08/04/2017   AST 23 08/04/2017   NA 133 (L) 08/04/2017   K 4.1 08/04/2017   CL 100 (L) 08/04/2017   CREATININE 0.76 08/04/2017   BUN 15 08/04/2017   CO2 26 08/04/2017   INR 1.07 08/01/2017      Assessment / Plan: The patient is clinically quite stable.  X-ray findings are noted above.  I did discuss these findings with Dr. Darcey Nora who also evaluated the x-ray.  The plan currently is to bring him back in 2 weeks with a repeat chest x-ray.  I discussed with the patient that if he were to become more symptomatic specifically with increasing shortness of breath to call our office or present to the emergency department.  I instructed him to continue using his incentive spirometer.  I instructed him not to do any activities which requires significant lifting or vagal maneuvers.          [name] PA-C 08/23/2017 11:27 AM

## 2017-09-10 ENCOUNTER — Other Ambulatory Visit: Payer: Self-pay | Admitting: Cardiothoracic Surgery

## 2017-09-10 DIAGNOSIS — J939 Pneumothorax, unspecified: Secondary | ICD-10-CM

## 2017-09-11 ENCOUNTER — Ambulatory Visit (INDEPENDENT_AMBULATORY_CARE_PROVIDER_SITE_OTHER): Payer: Self-pay | Admitting: Cardiothoracic Surgery

## 2017-09-11 ENCOUNTER — Encounter: Payer: Self-pay | Admitting: Cardiothoracic Surgery

## 2017-09-11 ENCOUNTER — Ambulatory Visit
Admission: RE | Admit: 2017-09-11 | Discharge: 2017-09-11 | Disposition: A | Discharge status: Home / Self Care | Payer: Medicare HMO | Source: Ambulatory Visit | Attending: Cardiothoracic Surgery | Admitting: Cardiothoracic Surgery

## 2017-09-11 VITALS — BP 130/80 | HR 62 | Resp 20 | Ht 70.0 in | Wt 173.0 lb

## 2017-09-11 DIAGNOSIS — J9383 Other pneumothorax: Secondary | ICD-10-CM

## 2017-09-11 DIAGNOSIS — R0789 Other chest pain: Secondary | ICD-10-CM | POA: Diagnosis not present

## 2017-09-11 DIAGNOSIS — Z902 Acquired absence of lung [part of]: Secondary | ICD-10-CM

## 2017-09-11 DIAGNOSIS — J939 Pneumothorax, unspecified: Secondary | ICD-10-CM

## 2017-09-11 NOTE — Progress Notes (Signed)
PCP is System, Pcp Not In Referring Provider is Lacretia Leigh, MD  Chief Complaint  Patient presents with  . Spontaneous Pneumothorax    2 week f/u with CXR  Second postop visit after right VATS for resection of bleb for spontaneous pneumothorax  HPI: Patient returns feeling much improved. He is return back to work. On his first postop office visit he had a 20% right pneumothorax which is now significantly improved and is now a small apical pneumothorax less than 5%. Incision is healed and his pain is well managed with over-the-counter medication. He is a nonsmoker. Pathology on the wedge of the bleb showed no malignancy Past Medical History:  Diagnosis Date  . Alcoholism in recovery ([place])   . Arthritis    shoulders  . Frequency of urination   . Hyperlipidemia 12/06/2014  . Prostate cancer [facility]) urologist--  dr wrenn/  oncologist-- dr Tammi Klippel   Stage T1c, Gleason 3+4, PSA 7.4  . Wears glasses     Past Surgical History:  Procedure Laterality Date  . CYSTOSCOPY N/A 06/02/2015   Procedure: CYSTOSCOPY FLEXIBLE;  Surgeon: Irine Seal, MD;  Location: [facility];  Service: Urology;  Laterality: N/A;  1 SEEDS FOUND IN BLADDER and removed  . INGUINAL HERNIA REPAIR Left 10/18/2014   Procedure: OPEN LEFT INGUINAL HERNIA REPAIR WITH MESH;  Surgeon: [name], MD;  Location: [facility];  Service: General;  Laterality: Left;  . INSERTION OF MESH Left 10/18/2014   Procedure: INSERTION OF MESH;  Surgeon: [name], MD;  Location: [facility];  Service: General;  Laterality: Left;  . PROSTATE BIOPSY    . RADIOACTIVE SEED IMPLANT N/A 06/02/2015   Procedure: RADIOACTIVE SEED IMPLANT/BRACHYTHERAPY IMPLANT;  Surgeon: Irine Seal, MD;  Location: [facility];  Service: Urology;  Laterality: N/A;   68 SEEDS IMPLANTED  . VIDEO ASSISTED THORACOSCOPY Right 08/02/2017   Procedure: VIDEO ASSISTED THORACOSCOPY WITH BLEB RESECTION;  Surgeon: Ivin Poot, MD;  Location: [facility];   Service: Thoracic;  Laterality: Right;  VATS/resection bleb    Family History  Problem Relation Age of Onset  . Heart disease Mother   . Colon cancer Neg Hx   . Rectal cancer Neg Hx   . Stomach cancer Neg Hx   . Esophageal cancer Neg Hx     Social History Social History   Tobacco Use  . Smoking status: Former Smoker    Packs/day: 1.00    Years: 40.00    Pack years: 40.00    Types: Cigarettes    Last attempt to quit: 08/20/2007    Years since quitting: 10.0  . Smokeless tobacco: Current User    Types: Snuff  Substance Use Topics  . Alcohol use: No    Alcohol/week: 0.0 oz    Comment: RECOVERING ALCOHOLIC -- QUIT    [id number]  . Drug use: No    Current Outpatient Medications  Medication Sig Dispense Refill  . acetaminophen (TYLENOL) 500 MG tablet Take 500 mg by mouth daily as needed for headache.    Marland Kitchen aspirin EC 81 MG EC tablet Take 1 tablet (81 mg total) by mouth daily.    . chlorpheniramine (CHLOR-TRIMETON) 4 MG tablet Take 4 mg by mouth every 6 (six) hours as needed for allergies.     No current facility-administered medications for this visit.     No Known Allergies  Review of Systems  Improved appetite Some muscular pain in his chest when he lays down at night No cough or  fever  BP 130/80   Pulse 62   Resp 20   Ht 5\' 10"  (1.778 m)   Wt 173 lb (78.5 kg)   SpO2 97% Comment: RA  BMI 24.82 kg/m  Physical Exam Alert and comfortable Neck without swelling Lungs clear VATS incisions healing well Heart rhythm regular  Diagnostic Tests: Chest x-ray clear with almost full resolution of postoperative pneumothorax  Impression: Excellent early recovery He knows he can drive do normal activities but should avoid heavy lifting strenuous activities until late March.  Plan: He'll return as needed.  Len Childs, MD [facility] [phone]

## 2018-05-18 IMAGING — CT CT CHEST W/O CM
2 of 4 series · 15 of 36 positions shown, 18 images · non-contrast
Comparison: Chest x-ray 07/29/2017

CLINICAL DATA: Chest pain, shortness of Breath

EXAM:
CT CHEST WITHOUT CONTRAST
TECHNIQUE: Multidetector CT imaging of the chest was performed following the
standard protocol without IV contrast.

[Series 2: thorax · axial · 0.81mm/px · z∈[+1538,+1832]mm · 12 of 175 slices shown, 15 images]
[im 14/175  mediastinal]
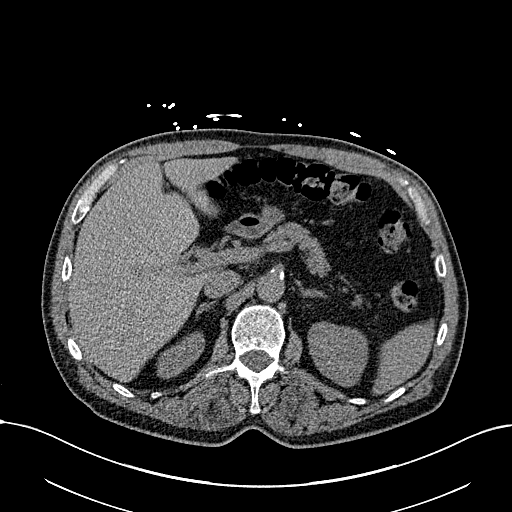
[im 14/175  lung]
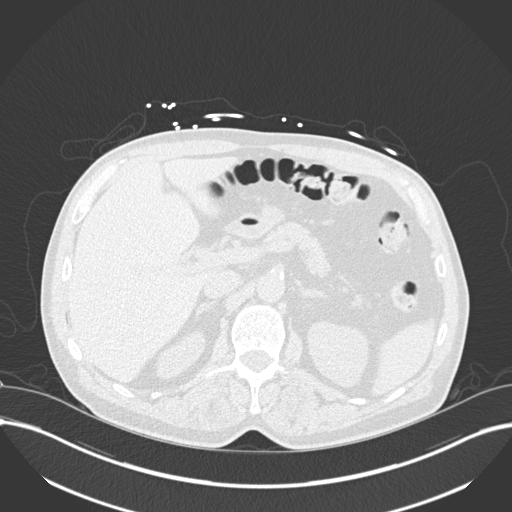
[im 27/175  lung]
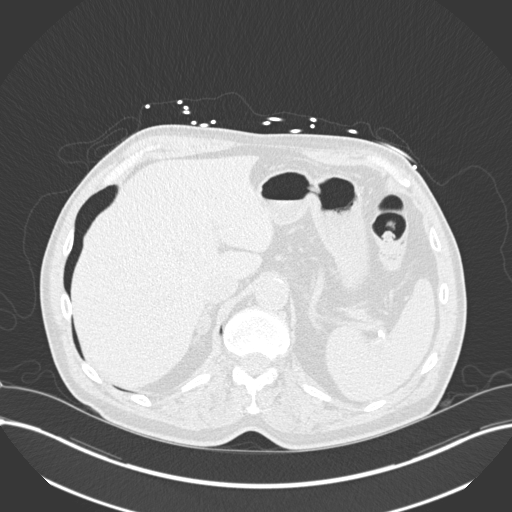
[im 41/175  lung]
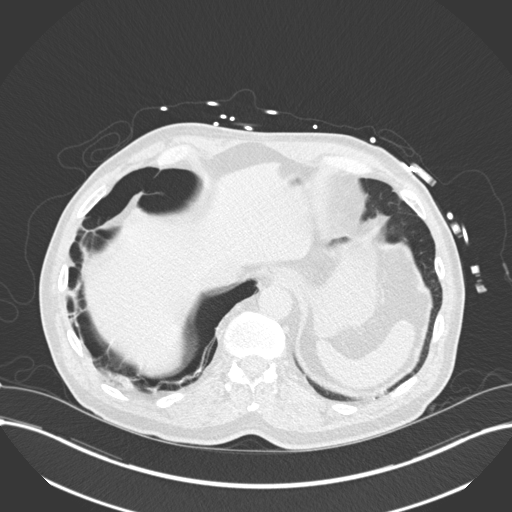
[im 54/175  lung]
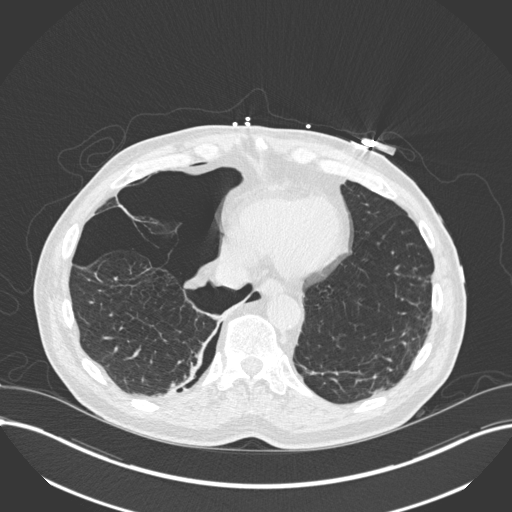
[im 67/175  mediastinal]
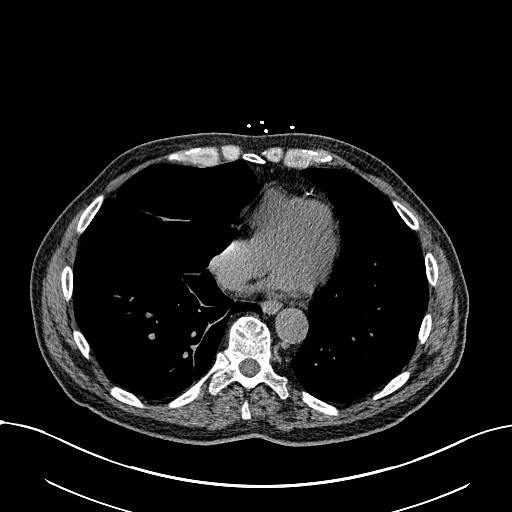
[im 67/175  lung]
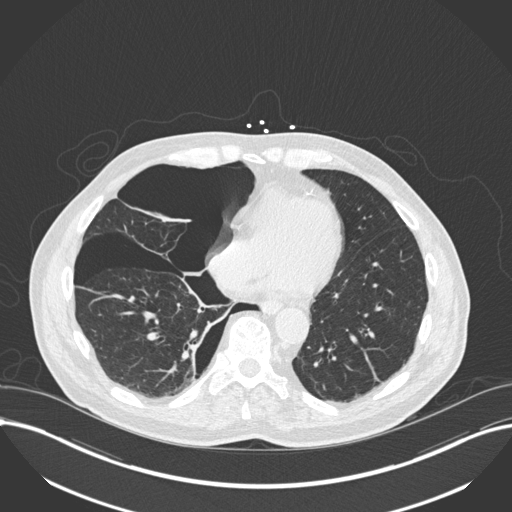
[im 81/175  lung]
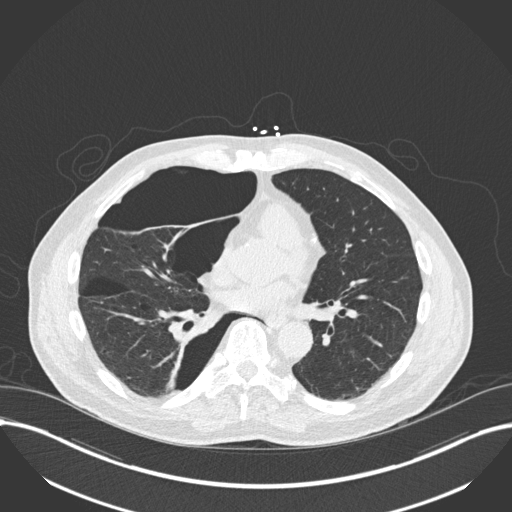
[im 94/175  lung]
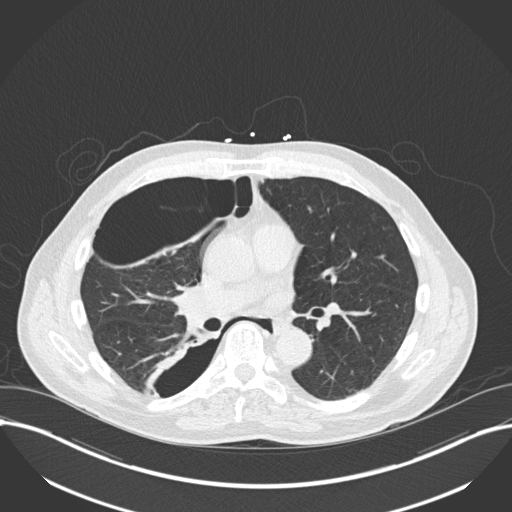
[im 108/175  lung]
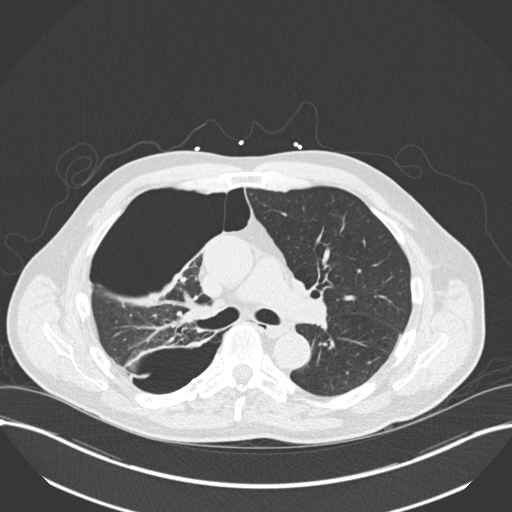
[im 121/175  mediastinal]
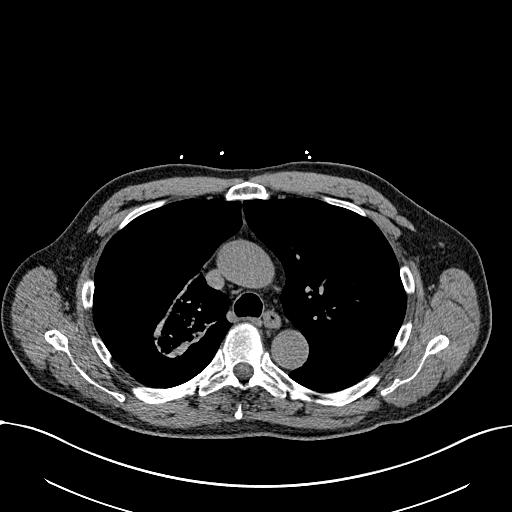
[im 121/175  lung]
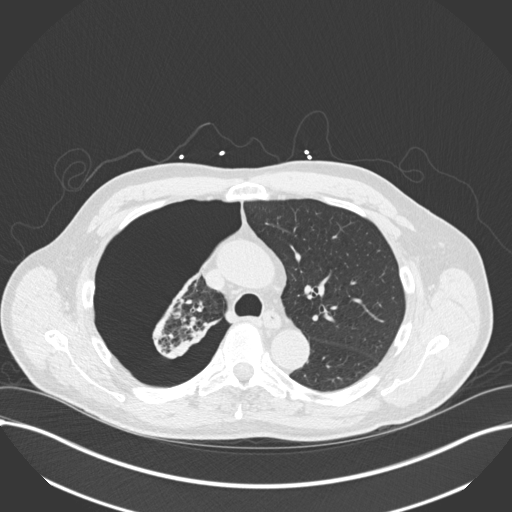
[im 134/175  lung]
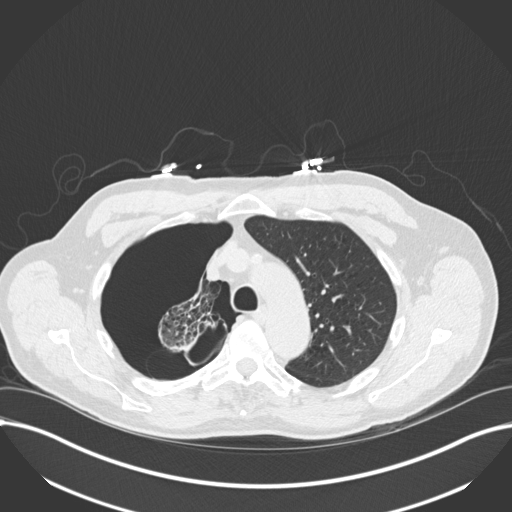
[im 148/175  lung]
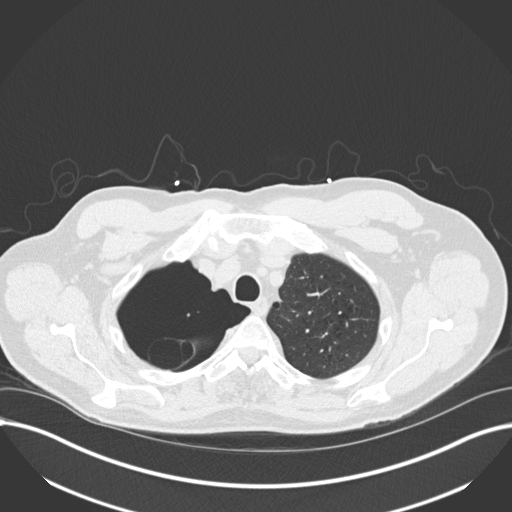
[im 161/175  lung]
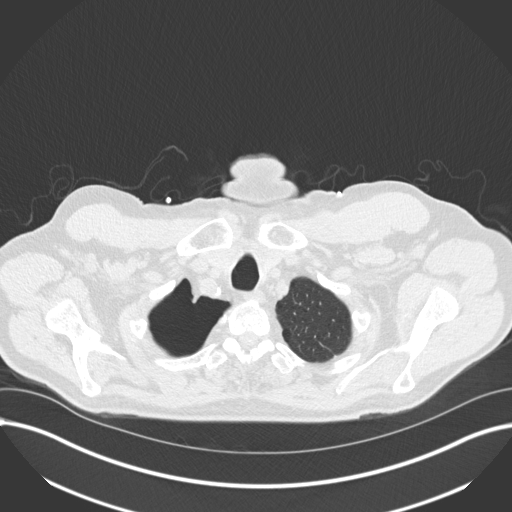

[Series 5: coronal · coronal · 0.75mm/px · 3 of 116 slices shown]
[im 24/116  lung]
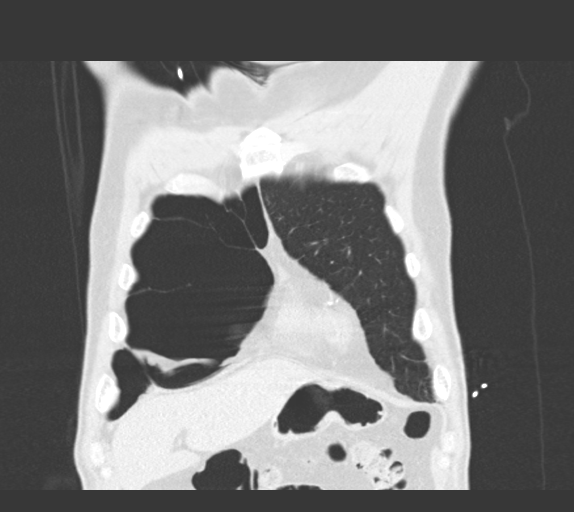
[im 47/116  lung]
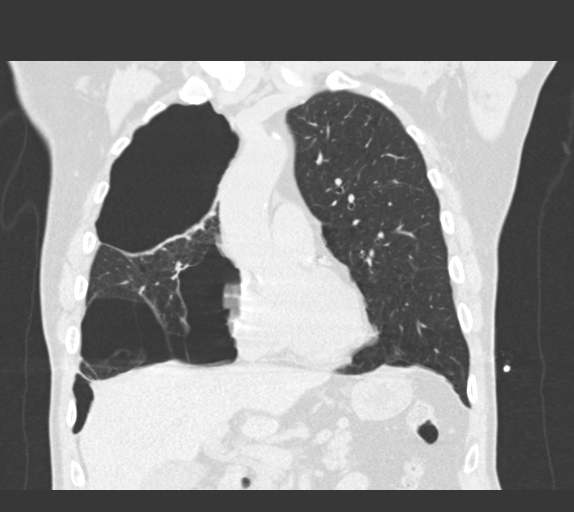
[im 70/116  lung]
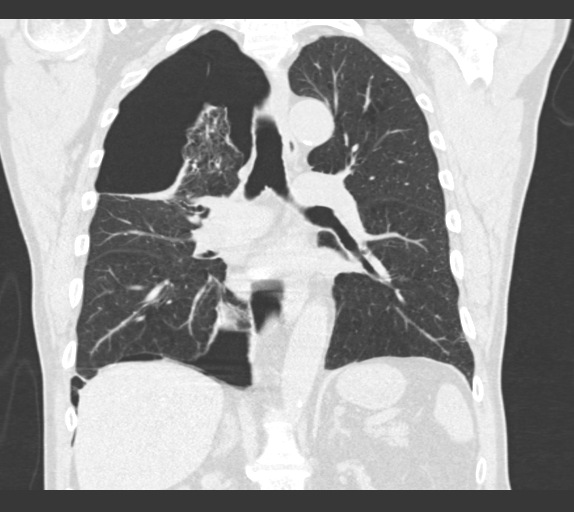

[15 of 36 positions shown; findings below may reference images not displayed]

FINDINGS: Cardiovascular: Moderate coronary artery calcifications, most
pronounced in the left anterior descending coronary artery.
Scattered aortic calcifications. No aneurysm. Heart is normal size.

Mediastinum/Nodes: No mediastinal, hilar, or axillary adenopathy.
Trachea and esophagus are unremarkable. Thyroid unremarkable.

Lungs/Pleura: Large pneumothorax involving the left lung as seen on
prior chest x-ray, 40-50% in size. Atelectasis in portions of the
right upper lobe. There appears to be a large air-filled 11 in the
right upper lobe. Mild centrilobular emphysema. No effusions.

Upper Abdomen: Imaging into the upper abdomen shows no acute
findings.

Musculoskeletal: Chest wall soft tissues are unremarkable. No acute
bony abnormality.
IMPRESSION: Large right side pneumothorax, 40-50%. Areas of atelectasis in a
right upper lobe. Large bleb noted in the right upper lobe/ apex.

Coronary artery disease.

Aortic Atherosclerosis (GQYW3-OLF.F) and Emphysema (GQYW3-TC9.I).

## 2018-05-18 IMAGING — DX DG CHEST 1V PORT
1 series · 1 of 1 positions shown · non-contrast
Comparison: 07/29/2017

CLINICAL DATA: Right-sided chest tube/pneumothorax.

EXAM:
PORTABLE CHEST 1 VIEW

[chest ap]
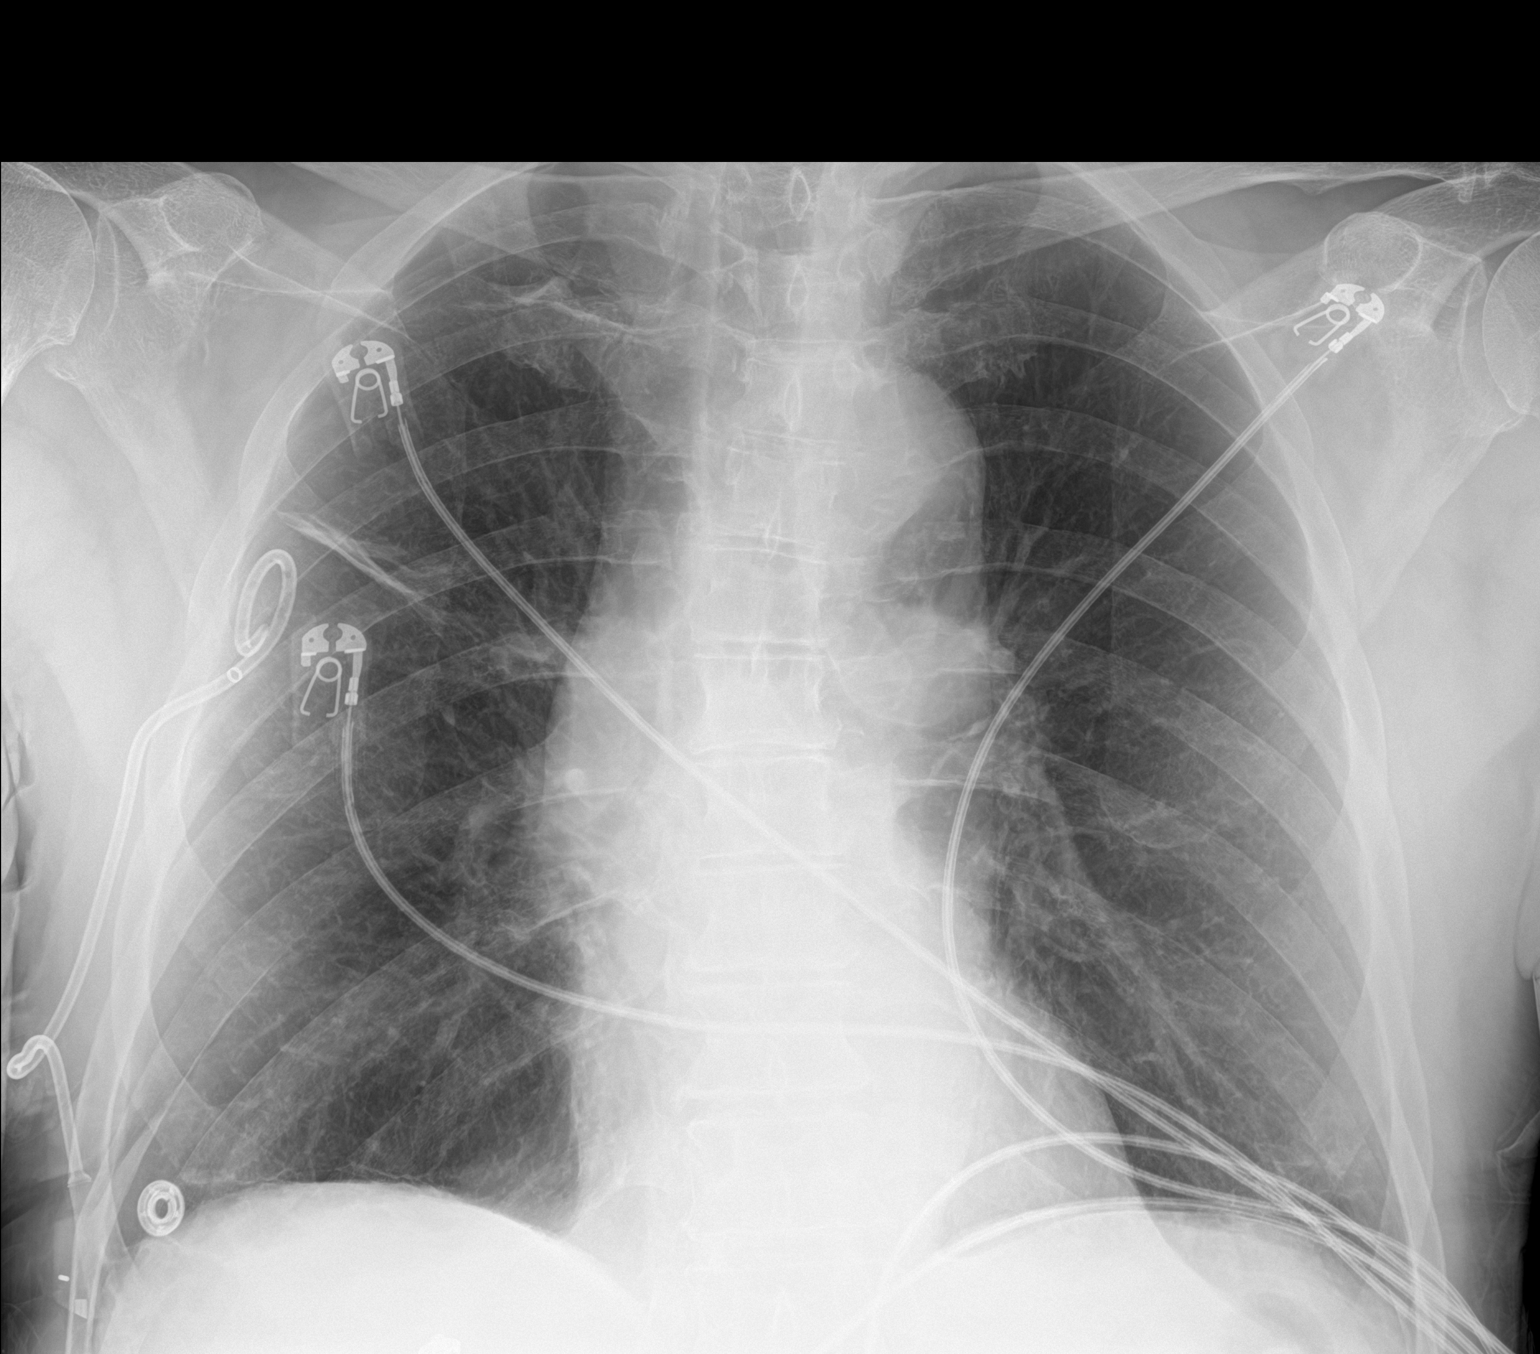

[1 of 1 positions shown; findings below may reference images not displayed]

FINDINGS: Interval placement of small caliber pigtail right-sided chest tube
which appears in adequate position. There has been interval
re-expansion of patient's right pneumothorax with small residual
right apical pneumothorax remaining. Possible loculated component of
pneumothorax in the right costophrenic angle. Linear scarring over
the right mid to upper lung. Left lung is clear. Cardiomediastinal
silhouette and remainder the exam is unchanged.
IMPRESSION: Moderate interval re-expansion of patient's right-sided pneumothorax
post small caliber pigtail chest tube placement. Small residual left
apical pneumothorax. Linear atelectasis/ scarring left mid to upper
lung.

## 2018-06-12 IMAGING — CR DG CHEST 2V
2 series · 2 of 2 positions shown · non-contrast
Comparison: 08/06/2017 and 08/05/2017

CLINICAL DATA: Right pneumothorax.

EXAM:
CHEST  2 VIEW

[w chest pa]
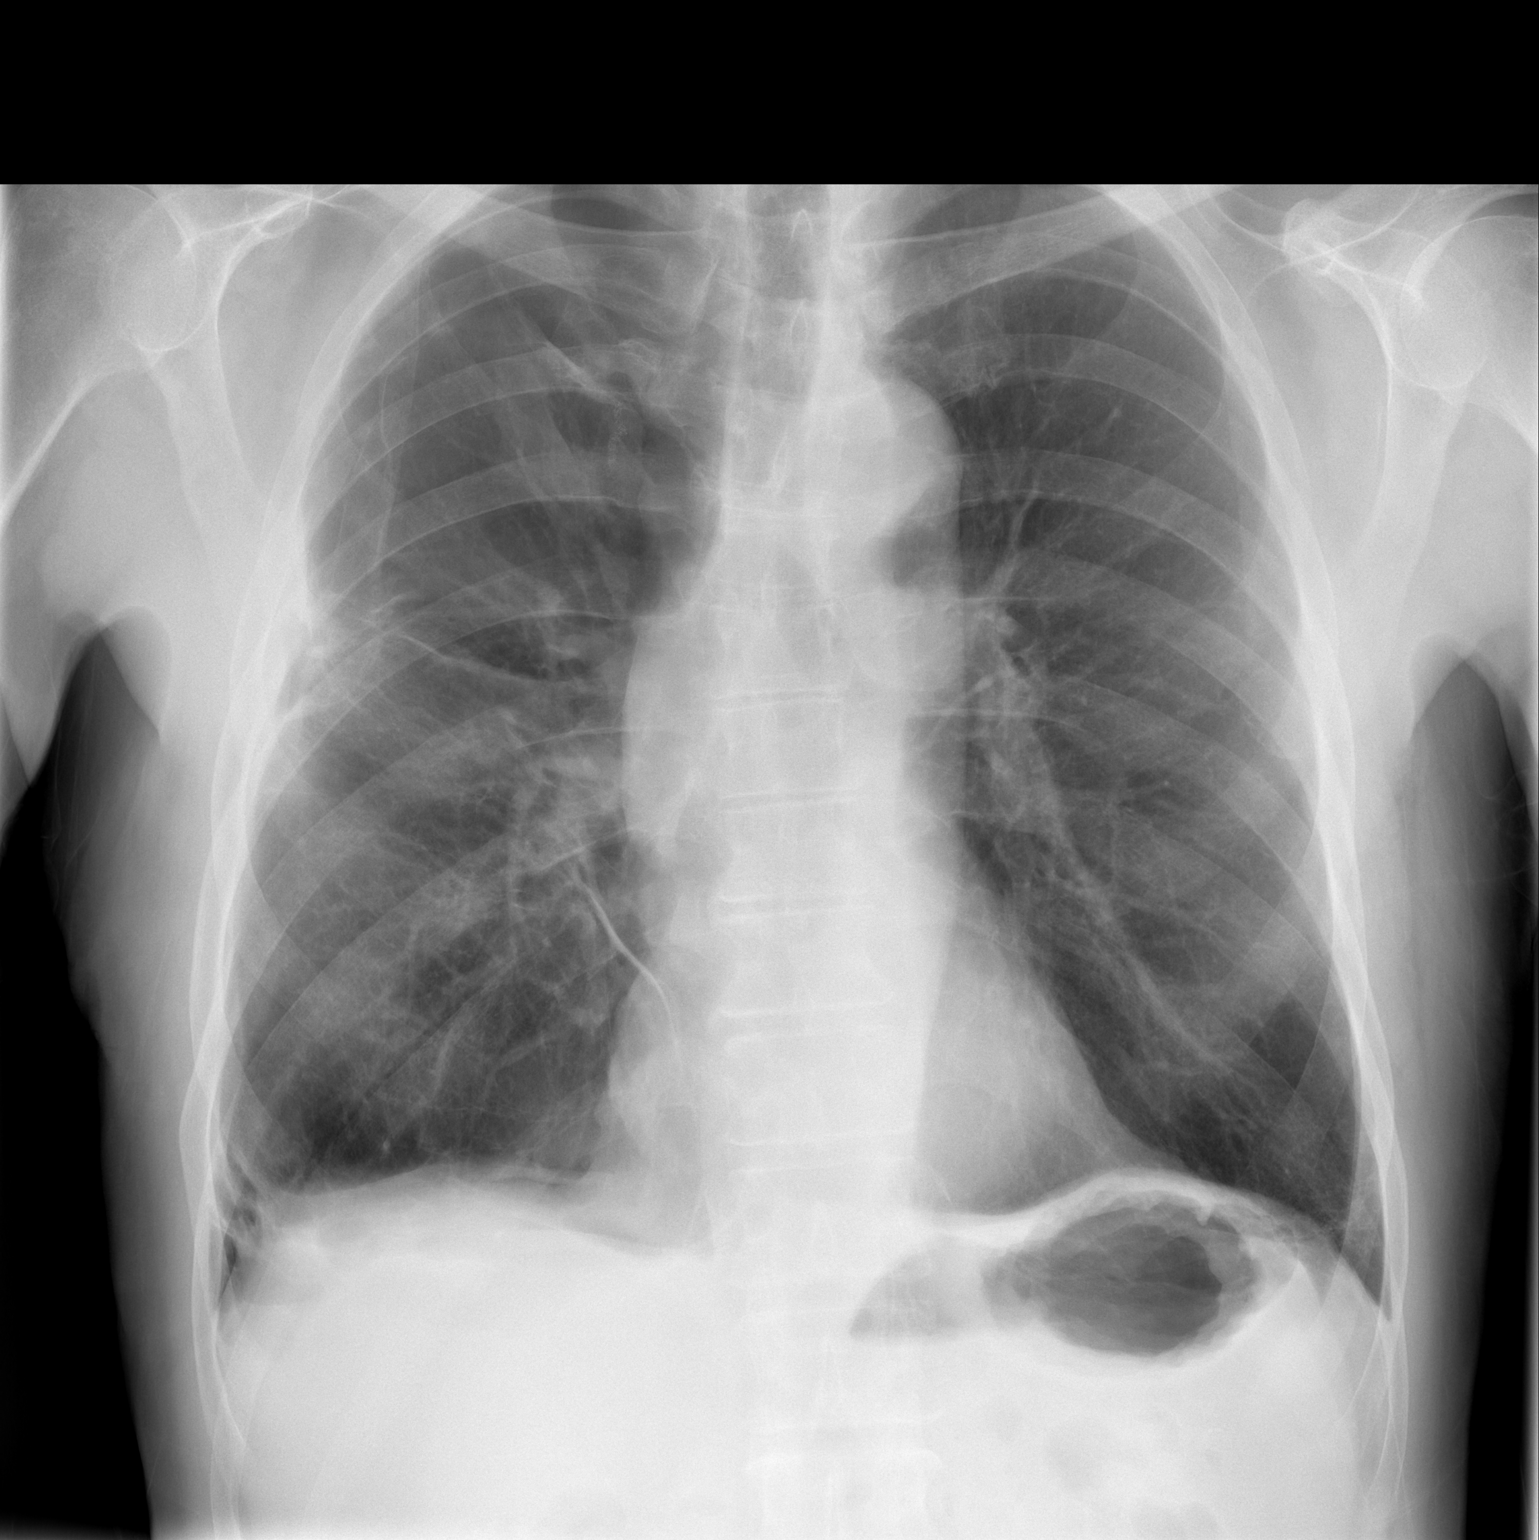

[w chest lat]
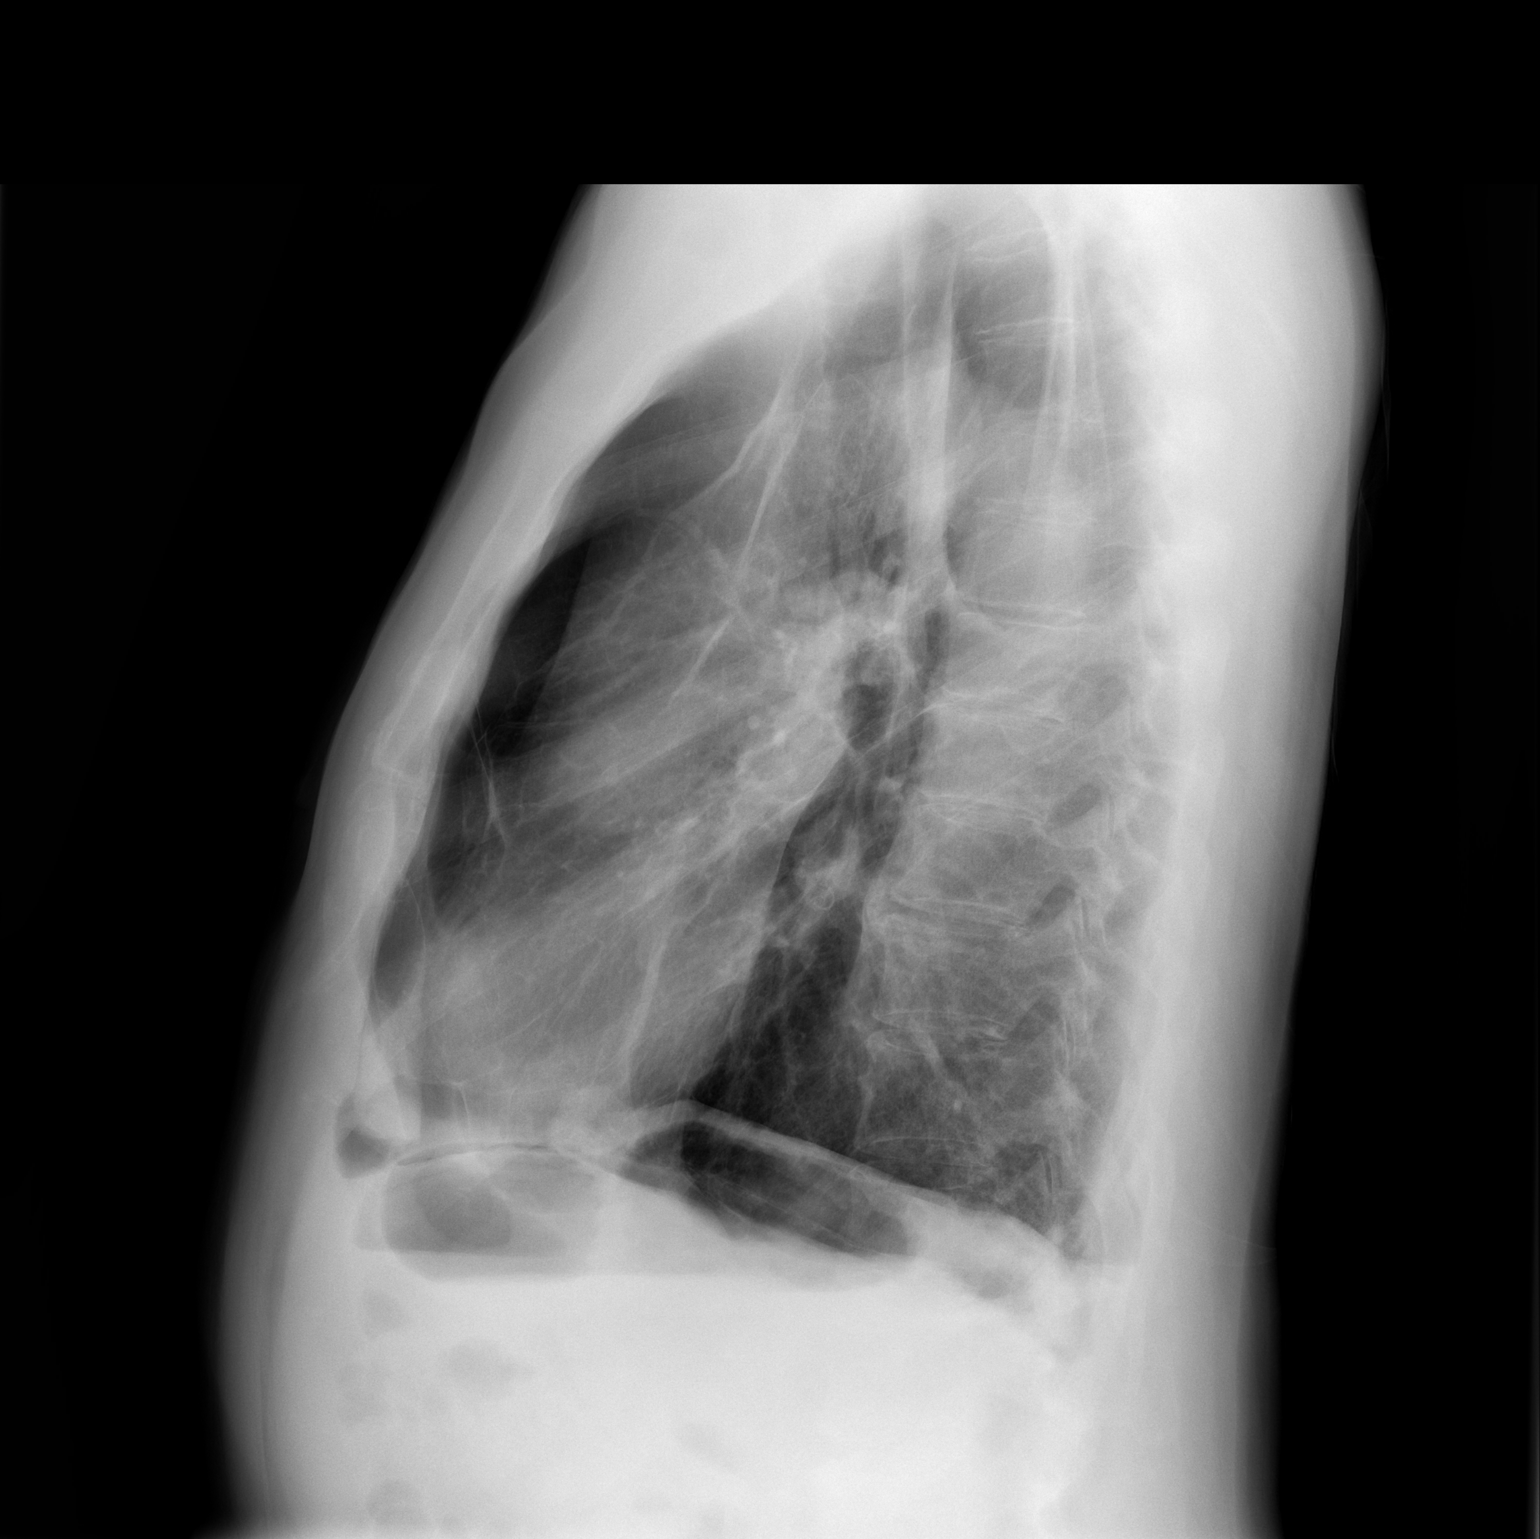

[2 of 2 positions shown; findings below may reference images not displayed]

FINDINGS: There is a recurrent right pneumothorax with apical, lateral,
medial, and basal components. This is probably 10-15%.

Heart size and pulmonary vascularity are normal. Left lung is clear.
No bone abnormality.
IMPRESSION: Recurrent complex right pneumothorax.

## 2018-09-22 DIAGNOSIS — J029 Acute pharyngitis, unspecified: Secondary | ICD-10-CM | POA: Diagnosis not present

## 2018-09-22 DIAGNOSIS — J069 Acute upper respiratory infection, unspecified: Secondary | ICD-10-CM | POA: Diagnosis not present

## 2018-09-22 DIAGNOSIS — R05 Cough: Secondary | ICD-10-CM | POA: Diagnosis not present

## 2018-09-22 DIAGNOSIS — R509 Fever, unspecified: Secondary | ICD-10-CM | POA: Diagnosis not present

## 2019-02-11 DIAGNOSIS — Z8709 Personal history of other diseases of the respiratory system: Secondary | ICD-10-CM | POA: Diagnosis not present

## 2019-02-11 DIAGNOSIS — K0889 Other specified disorders of teeth and supporting structures: Secondary | ICD-10-CM | POA: Diagnosis not present

## 2019-02-11 DIAGNOSIS — Z8639 Personal history of other endocrine, nutritional and metabolic disease: Secondary | ICD-10-CM | POA: Diagnosis not present

## 2019-04-01 DIAGNOSIS — H18892 Other specified disorders of cornea, left eye: Secondary | ICD-10-CM | POA: Diagnosis not present

## 2019-04-01 DIAGNOSIS — T1502XA Foreign body in cornea, left eye, initial encounter: Secondary | ICD-10-CM | POA: Diagnosis not present

## 2019-04-02 DIAGNOSIS — H18892 Other specified disorders of cornea, left eye: Secondary | ICD-10-CM | POA: Diagnosis not present

## 2019-04-02 DIAGNOSIS — T1502XD Foreign body in cornea, left eye, subsequent encounter: Secondary | ICD-10-CM | POA: Diagnosis not present

## 2019-05-06 DIAGNOSIS — H524 Presbyopia: Secondary | ICD-10-CM | POA: Diagnosis not present

## 2019-05-06 DIAGNOSIS — H2513 Age-related nuclear cataract, bilateral: Secondary | ICD-10-CM | POA: Diagnosis not present

## 2019-05-06 DIAGNOSIS — H5213 Myopia, bilateral: Secondary | ICD-10-CM | POA: Diagnosis not present

## 2019-05-06 DIAGNOSIS — H52203 Unspecified astigmatism, bilateral: Secondary | ICD-10-CM | POA: Diagnosis not present

## 2019-09-01 ENCOUNTER — Ambulatory Visit: Payer: Medicare HMO

## 2019-09-18 ENCOUNTER — Ambulatory Visit: Payer: Medicare HMO

## 2019-09-18 ENCOUNTER — Ambulatory Visit: Payer: Medicare HMO | Attending: Internal Medicine

## 2019-09-18 DIAGNOSIS — Z23 Encounter for immunization: Secondary | ICD-10-CM

## 2019-09-18 NOTE — Progress Notes (Signed)
   Covid-19 Vaccination Clinic  Name:  Cory Willis    MRN: [id number] DOB: 12-16-1950  09/18/2019  Mr. [name] was observed post Covid-19 immunization for 15 minutes without incidence. He was provided with Vaccine Information Sheet and instruction to access the V-Safe system.   Mr. [name] was instructed to call 911 with any severe reactions post vaccine: Marland Kitchen Difficulty breathing  . Swelling of your face and throat  . A fast heartbeat  . A bad rash all over your body  . Dizziness and weakness    Immunizations Administered    Name Date Dose VIS Date Route   Pfizer COVID-19 Vaccine 09/18/2019  8:18 AM 0.3 mL 07/17/2019 Intramuscular   Manufacturer: [place]   Lot: [id number]   [place]: [id number]

## 2019-10-10 ENCOUNTER — Ambulatory Visit: Payer: Medicare HMO | Attending: Internal Medicine

## 2019-10-10 DIAGNOSIS — Z23 Encounter for immunization: Secondary | ICD-10-CM

## 2019-10-10 NOTE — Progress Notes (Signed)
   Covid-19 Vaccination Clinic  Name:  Cory Willis    MRN: [id number] DOB: [birth year]  10/10/2019  [name] was observed post Covid-19 immunization for 15 minutes without incident. He was provided with Vaccine Information Sheet and instruction to access the V-Safe system.   [name] was instructed to call 911 with any severe reactions post vaccine: Marland Kitchen Difficulty breathing  . Swelling of face and throat  . A fast heartbeat  . A bad rash all over body  . Dizziness and weakness   Immunizations Administered    Name Date Dose VIS Date Route   Pfizer COVID-19 Vaccine 10/10/2019  2:08 PM 0.3 mL 07/17/2019 Intramuscular   Manufacturer: [place]   Lot: [id number]   [place]: [id number]

## 2020-02-16 DIAGNOSIS — J9811 Atelectasis: Secondary | ICD-10-CM | POA: Diagnosis not present

## 2020-02-16 DIAGNOSIS — Z1159 Encounter for screening for other viral diseases: Secondary | ICD-10-CM | POA: Diagnosis not present

## 2020-02-16 DIAGNOSIS — Z8639 Personal history of other endocrine, nutritional and metabolic disease: Secondary | ICD-10-CM | POA: Diagnosis not present

## 2020-02-16 DIAGNOSIS — Z13220 Encounter for screening for lipoid disorders: Secondary | ICD-10-CM | POA: Diagnosis not present

## 2020-02-16 DIAGNOSIS — R69 Illness, unspecified: Secondary | ICD-10-CM | POA: Diagnosis not present

## 2020-02-16 DIAGNOSIS — E7800 Pure hypercholesterolemia, unspecified: Secondary | ICD-10-CM | POA: Diagnosis not present

## 2020-02-16 DIAGNOSIS — Z0000 Encounter for general adult medical examination without abnormal findings: Secondary | ICD-10-CM | POA: Diagnosis not present

## 2020-02-16 DIAGNOSIS — R5383 Other fatigue: Secondary | ICD-10-CM | POA: Diagnosis not present

## 2020-02-16 DIAGNOSIS — E559 Vitamin D deficiency, unspecified: Secondary | ICD-10-CM | POA: Diagnosis not present

## 2020-02-16 DIAGNOSIS — Z114 Encounter for screening for human immunodeficiency virus [HIV]: Secondary | ICD-10-CM | POA: Diagnosis not present

## 2020-02-16 DIAGNOSIS — Z131 Encounter for screening for diabetes mellitus: Secondary | ICD-10-CM | POA: Diagnosis not present

## 2020-02-16 DIAGNOSIS — C61 Malignant neoplasm of prostate: Secondary | ICD-10-CM | POA: Diagnosis not present

## 2020-02-16 DIAGNOSIS — R001 Bradycardia, unspecified: Secondary | ICD-10-CM | POA: Diagnosis not present

## 2020-02-17 DIAGNOSIS — R001 Bradycardia, unspecified: Secondary | ICD-10-CM | POA: Diagnosis not present

## 2020-02-24 DIAGNOSIS — R9431 Abnormal electrocardiogram [ECG] [EKG]: Secondary | ICD-10-CM | POA: Diagnosis not present

## 2020-03-08 DIAGNOSIS — Z711 Person with feared health complaint in whom no diagnosis is made: Secondary | ICD-10-CM | POA: Diagnosis not present

## 2020-03-08 DIAGNOSIS — E559 Vitamin D deficiency, unspecified: Secondary | ICD-10-CM | POA: Diagnosis not present

## 2020-03-08 DIAGNOSIS — R7303 Prediabetes: Secondary | ICD-10-CM | POA: Diagnosis not present

## 2020-03-08 DIAGNOSIS — Z8639 Personal history of other endocrine, nutritional and metabolic disease: Secondary | ICD-10-CM | POA: Diagnosis not present

## 2020-03-08 DIAGNOSIS — C61 Malignant neoplasm of prostate: Secondary | ICD-10-CM | POA: Diagnosis not present

## 2020-03-10 DIAGNOSIS — L821 Other seborrheic keratosis: Secondary | ICD-10-CM | POA: Diagnosis not present

## 2020-03-29 DIAGNOSIS — K820 Obstruction of gallbladder: Secondary | ICD-10-CM | POA: Diagnosis not present

## 2020-03-29 DIAGNOSIS — L821 Other seborrheic keratosis: Secondary | ICD-10-CM | POA: Diagnosis not present

## 2020-05-05 DIAGNOSIS — H524 Presbyopia: Secondary | ICD-10-CM | POA: Diagnosis not present

## 2020-05-05 DIAGNOSIS — H25813 Combined forms of age-related cataract, bilateral: Secondary | ICD-10-CM | POA: Diagnosis not present

## 2020-05-05 DIAGNOSIS — H52203 Unspecified astigmatism, bilateral: Secondary | ICD-10-CM | POA: Diagnosis not present

## 2020-05-05 DIAGNOSIS — H5213 Myopia, bilateral: Secondary | ICD-10-CM | POA: Diagnosis not present
